# Patient Record
Sex: Male | Born: 1990 | Race: Black or African American | Hispanic: No | Marital: Single
Health system: Southern US, Community
[De-identification: ages and names within clinical notes are randomized; demographics above are authoritative.]

## PROBLEM LIST (undated history)

## (undated) ENCOUNTER — Emergency Department (HOSPITAL_COMMUNITY): Admission: EM | Payer: Self-pay

## (undated) ENCOUNTER — Emergency Department (HOSPITAL_COMMUNITY): Admission: EM | Payer: Managed Care, Other (non HMO) | Source: Home / Self Care

## (undated) DIAGNOSIS — J45909 Unspecified asthma, uncomplicated: Secondary | ICD-10-CM

## (undated) HISTORY — PX: TONSILLECTOMY: SUR1361

## (undated) HISTORY — PX: HERNIA REPAIR: SHX51

---
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
## 2014-10-05 ENCOUNTER — Inpatient Hospital Stay (HOSPITAL_COMMUNITY)
Admission: AD | Admit: 2014-10-05 | Discharge: 2014-10-07 | DRG: 882 | Disposition: A | Discharge status: Home / Self Care | Payer: 59 | Source: Intra-hospital | Attending: Psychiatry | Admitting: Psychiatry

## 2014-10-05 ENCOUNTER — Encounter (HOSPITAL_COMMUNITY): Payer: Self-pay | Admitting: Behavioral Health

## 2014-10-05 ENCOUNTER — Emergency Department (HOSPITAL_COMMUNITY)
Admission: EM | Admit: 2014-10-05 | Discharge: 2014-10-05 | Disposition: A | Discharge status: Other Acute Inpatient Hospital | Payer: 59 | Attending: Emergency Medicine | Admitting: Emergency Medicine

## 2014-10-05 ENCOUNTER — Encounter (HOSPITAL_COMMUNITY): Payer: Self-pay | Admitting: Emergency Medicine

## 2014-10-05 DIAGNOSIS — Z720 Tobacco use: Secondary | ICD-10-CM | POA: Diagnosis not present

## 2014-10-05 DIAGNOSIS — F17210 Nicotine dependence, cigarettes, uncomplicated: Secondary | ICD-10-CM | POA: Diagnosis present

## 2014-10-05 DIAGNOSIS — R451 Restlessness and agitation: Secondary | ICD-10-CM | POA: Insufficient documentation

## 2014-10-05 DIAGNOSIS — R45850 Homicidal ideations: Secondary | ICD-10-CM

## 2014-10-05 DIAGNOSIS — Z79899 Other long term (current) drug therapy: Secondary | ICD-10-CM | POA: Insufficient documentation

## 2014-10-05 DIAGNOSIS — J45909 Unspecified asthma, uncomplicated: Secondary | ICD-10-CM | POA: Diagnosis present

## 2014-10-05 DIAGNOSIS — R454 Irritability and anger: Secondary | ICD-10-CM

## 2014-10-05 DIAGNOSIS — F4325 Adjustment disorder with mixed disturbance of emotions and conduct: Secondary | ICD-10-CM | POA: Diagnosis not present

## 2014-10-05 DIAGNOSIS — F4329 Adjustment disorder with other symptoms: Principal | ICD-10-CM | POA: Diagnosis present

## 2014-10-05 DIAGNOSIS — F99 Mental disorder, not otherwise specified: Secondary | ICD-10-CM | POA: Diagnosis present

## 2014-10-05 HISTORY — DX: Unspecified asthma, uncomplicated: J45.909

## 2014-10-05 LAB — BASIC METABOLIC PANEL
ANION GAP: 8 (ref 5–15)
BUN: 15 mg/dL (ref 6–20)
CALCIUM: 9.4 mg/dL (ref 8.9–10.3)
CO2: 25 mmol/L (ref 22–32)
CREATININE: 1.01 mg/dL (ref 0.61–1.24)
Chloride: 106 mmol/L (ref 101–111)
GFR calc Af Amer: >60 mL/min (ref >60)
Glucose, Bld: 88 mg/dL (ref 65–99)
Potassium: 3.9 mmol/L (ref 3.5–5.1)
Sodium: 139 mmol/L (ref 135–145)

## 2014-10-05 LAB — RAPID URINE DRUG SCREEN, HOSP PERFORMED
AMPHETAMINES: NOT DETECTED
BENZODIAZEPINES: NOT DETECTED
Barbiturates: NOT DETECTED
COCAINE: NOT DETECTED
OPIATES: NOT DETECTED
TETRAHYDROCANNABINOL: POSITIVE — AB

## 2014-10-05 LAB — CBC WITH DIFFERENTIAL/PLATELET
BASOS PCT: 1 % (ref 0–1)
Basophils Absolute: 0 10*3/uL (ref 0.0–0.1)
EOS ABS: 0.4 10*3/uL (ref 0.0–0.7)
EOS PCT: 10 % — AB (ref 0–5)
HEMATOCRIT: 43.1 % (ref 39.0–52.0)
HEMOGLOBIN: 15.2 g/dL (ref 13.0–17.0)
LYMPHS ABS: 1.5 10*3/uL (ref 0.7–4.0)
Lymphocytes Relative: 35 % (ref 12–46)
MCH: 30.7 pg (ref 26.0–34.0)
MCHC: 35.3 g/dL (ref 30.0–36.0)
MCV: 87.1 fL (ref 78.0–100.0)
MONO ABS: 0.5 10*3/uL (ref 0.1–1.0)
MONOS PCT: 11 % (ref 3–12)
Neutro Abs: 1.9 10*3/uL (ref 1.7–7.7)
Neutrophils Relative %: 43 % (ref 43–77)
Platelets: 184 10*3/uL (ref 150–400)
RBC: 4.95 MIL/uL (ref 4.22–5.81)
RDW: 12.5 % (ref 11.5–15.5)
WBC: 4.3 10*3/uL (ref 4.0–10.5)

## 2014-10-05 LAB — ACETAMINOPHEN LEVEL

## 2014-10-05 LAB — SALICYLATE LEVEL: Salicylate Lvl: <4 mg/dL (ref 2.8–30.0)

## 2014-10-05 LAB — ETHANOL

## 2014-10-05 MED ORDER — ACETAMINOPHEN 325 MG PO TABS: 650.0000 mg | ORAL_TABLET | Freq: Four times a day (QID) | ORAL | Status: DC | PRN

## 2014-10-05 MED ORDER — NICOTINE 21 MG/24HR TD PT24
21.0000 mg | MEDICATED_PATCH | Freq: Every day | TRANSDERMAL | Status: DC
  Administered 2014-10-05: 21 mg via TRANSDERMAL
  Filled 2014-10-05: qty 1

## 2014-10-05 MED ORDER — ALUM & MAG HYDROXIDE-SIMETH 200-200-20 MG/5ML PO SUSP: 30.0000 mL | ORAL | Status: DC | PRN

## 2014-10-05 MED ORDER — TRAZODONE HCL 50 MG PO TABS
50.0000 mg | ORAL_TABLET | Freq: Every day | ORAL | Status: DC
  Administered 2014-10-05 – 2014-10-06 (×2): 50 mg via ORAL
  Filled 2014-10-05 (×5): qty 1
  Filled 2014-10-05: qty 14

## 2014-10-05 MED ORDER — LORAZEPAM 1 MG PO TABS
1.0000 mg | ORAL_TABLET | Freq: Three times a day (TID) | ORAL | Status: DC | PRN
  Administered 2014-10-05: 1 mg via ORAL
  Filled 2014-10-05: qty 1

## 2014-10-05 MED ORDER — ALBUTEROL SULFATE HFA 108 (90 BASE) MCG/ACT IN AERS: 1.0000 | INHALATION_SPRAY | Freq: Four times a day (QID) | RESPIRATORY_TRACT | Status: DC | PRN

## 2014-10-05 MED ORDER — IBUPROFEN 400 MG PO TABS: 600.0000 mg | ORAL_TABLET | Freq: Three times a day (TID) | ORAL | Status: DC | PRN

## 2014-10-05 MED ORDER — IBUPROFEN 400 MG PO TABS: 400.0000 mg | ORAL_TABLET | Freq: Four times a day (QID) | ORAL | Status: DC | PRN

## 2014-10-05 MED ORDER — LORATADINE 10 MG PO TABS
10.0000 mg | ORAL_TABLET | Freq: Every day | ORAL | Status: DC
  Administered 2014-10-05 – 2014-10-07 (×3): 10 mg via ORAL
  Filled 2014-10-05 (×7): qty 1

## 2014-10-05 MED ORDER — NICOTINE 14 MG/24HR TD PT24
14.0000 mg | MEDICATED_PATCH | Freq: Every day | TRANSDERMAL | Status: DC
  Administered 2014-10-06 – 2014-10-07 (×2): 14 mg via TRANSDERMAL
  Filled 2014-10-05 (×6): qty 1

## 2014-10-05 MED ORDER — MAGNESIUM HYDROXIDE 400 MG/5ML PO SUSP: 30.0000 mL | Freq: Every day | ORAL | Status: DC | PRN

## 2014-10-05 NOTE — ED Notes (Signed)
Pt. requesting psychiatric evaluation for his uncontrolled anger and emotional strain/distress onset last year , denies suicidal ideation / no hallucinations.

## 2014-10-05 NOTE — BHH Counselor (Signed)
Per Thurman CoyerEric Kaplan RN, pt has been accepted to bed 400-2. Writer notified pt's RN Kriste BasqueBecky and asked that pt be transported after 12 pm.  Evette Cristalaroline Paige Eisen Robenson, [place] Therapeutic Triage Specialist
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
## 2014-10-05 NOTE — ED Notes (Signed)
Trained sitter at bedside. 

## 2014-10-05 NOTE — BHH Counselor (Signed)
Pt. is being reviewed for possible placement with [facility]. 

## 2014-10-05 NOTE — ED Notes (Signed)
Pt has been wanded by security, belongings with mother.
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
## 2014-10-05 NOTE — ED Notes (Signed)
[facility] Id[facility] NeedlePaige, [facility] Dr Tobie PoetWithrow - may transport after 1200.

## 2014-10-05 NOTE — Progress Notes (Signed)
[facility] Group Notes:  (Nursing/MHT/Case Management/Adjunct)  Date:  10/05/2014  Time:  8:46 PM  Type of Therapy:  Psychoeducational Skills  Participation Level:  Active  Participation Quality:  Appropriate  Affect:  Appropriate  Cognitive:  Appropriate  Insight:  Appropriate  Engagement in Group:  Limited  Modes of Intervention:  Discussion  Summary of Progress/Problems: Tonight in wrap up group, Jamaury was limited in his conversation but he did mention that today was his first day and that it was ok. He was interested in the 72hr discharge and was informed more from his nurse on this information. Madaline SavageDiamond N Keneshia Tena 10/05/2014, 8:46 PM

## 2014-10-05 NOTE — Progress Notes (Signed)
Recreation Therapy Notes  Animal-Assisted Activity (AAA) Program Checklist/Progress Notes Patient Eligibility Criteria Checklist & Daily Group note for Rec Tx Intervention  Date: 10/05/14 Time: 2:30pm Location: 400 Hall Dayroom   AAA/T Program Assumption of Risk Form signed by Patient/ or Parent Legal Guardian YES   Patient is free of allergies or sever asthma YES   Patient reports no fear of animals YES  Patient reports no history of cruelty to animals YES   Patient understands his/her participation is voluntary YES   Patient washes hands before animal contact YES   Patient washes hands after animal contact YES  Behavioral Response: None  Education: Hand Washing, Appropriate Animal Interaction   Education Outcome: Acknowledges understanding/In group clarification offered/Needs additional education.   Clinical Observations/Feedback: Patient did not attend group.  Ferol Laiche, LRT, CTRS         Aslyn Cottman A 10/05/2014 4:14 PM 

## 2014-10-05 NOTE — ED Notes (Signed)
Called staffing office for sitter, charge RN aware.

## 2014-10-06 ENCOUNTER — Encounter (HOSPITAL_COMMUNITY): Payer: Self-pay | Admitting: Psychiatry

## 2014-10-06 DIAGNOSIS — F4325 Adjustment disorder with mixed disturbance of emotions and conduct: Secondary | ICD-10-CM

## 2014-10-06 DIAGNOSIS — R45850 Homicidal ideations: Secondary | ICD-10-CM

## 2014-10-06 MED ORDER — OLANZAPINE 5 MG PO TABS
5.0000 mg | ORAL_TABLET | Freq: Every day | ORAL | Status: DC
  Administered 2014-10-06: 5 mg via ORAL
  Filled 2014-10-06: qty 14
  Filled 2014-10-06 (×3): qty 1

## 2014-10-06 NOTE — Progress Notes (Signed)
Recreation Therapy Notes  Date:  10/06/14 Time: 9:30am Location: 300 Hall Dayroom  Group Topic: Stress Management  Goal Area(s) Addresses:  Patient will actively participate in stress management techniques presented during session.   Intervention: Stress management techniques  Activity:  Guided Imagery. LRT read a script that walked patients through the technique Guided Imagery. Technique was coupled with deep breathing.   Education:  Stress Management, Discharge Planning.   Clinical Observations/Feedback: Patient did not attend group.   Chaitanya Amedee LRT/CTRS         Latreshia Beauchaine A 10/06/2014 3:44 PM 
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
## 2014-10-06 NOTE — Plan of Care (Signed)
Problem: Ineffective individual coping Goal: STG:Pt. will utilize relaxation techniques to reduce stress STG: Patient will utilize relaxation techniques to reduce stress levels  Outcome: Not Progressing Patient unreceptive to admission. Slamming, banging phone.  Problem: Alteration in mood Goal: STG-Patient is able to discuss feelings and issues (Patient is able to discuss feelings and issues leading to depression)  Outcome: Not Progressing Patient will only discuss when he will be seen by the MD and subsequently released.

## 2014-10-06 NOTE — [facility] Group Notes (Signed)
[facility]   10/06/2014 9:30 AM  Participation Quality:  Did not attend group.  Robert [name], Robert [name]

## 2014-10-06 NOTE — Progress Notes (Signed)
[facility] Group Notes:  (Nursing/MHT/Case Management/Adjunct)  Date:  10/06/2014  Time:  10:10 PM  Type of Therapy:  Psychoeducational Skills  Participation Level:  Active  Participation Quality:  Appropriate  Affect:  Appropriate  Cognitive:  Appropriate  Insight:  Appropriate  Engagement in Group:  Engaged  Modes of Intervention:  Discussion  Summary of Progress/Problems: Tonight in Wrap up group Munachimso stated that his day did not go so well and that his goal was just for him to talk to him doctor so that he could be discharged. Madaline Savageiamond N Shamar Engelmann 10/06/2014, 10:10 PM

## 2014-10-06 NOTE — [facility] Suicide Risk Assessment (Signed)
[facility] INPATIENT:  Family/Significant Other Suicide Prevention Education  Suicide Prevention Education:  Education Completed; Deitra MayoLeslie Bensman (mother) [phone],  (name of family member/significant other) has been identified by the patient as the family member/significant other with whom the patient will be residing, and identified as the person(s) who will aid the patient in the event of a mental health crisis (suicidal ideations/suicide attempt).  With written consent from the patient, the family member/significant other has been provided the following suicide prevention education, prior to the and/or following the discharge of the patient.  The suicide prevention education provided includes the following:  Suicide risk factors  Suicide prevention and interventions  National Suicide Hotline telephone number  [facility] assessment telephone number  [facility] Assistance 911  [facility] and/or [facility] telephone number  Request made of family/significant other to:  Remove weapons (e.g., guns, rifles, knives), all items previously/currently identified as safety concern.    Remove drugs/medications (over-the-counter, prescriptions, illicit drugs), all items previously/currently identified as a safety concern.  The family member/significant other verbalizes understanding of the suicide prevention education information provided.  The family member/significant other agrees to remove the items of safety concern listed above.  Reviewed SPE w mother, mother states patient does not live in her home.  She is unsure whether he has access to guns, says "these kids they can get whatever they want on the street."  Mother unsure whether patient has access to medications or other areas of concern.  Mother received information on risks, warning signs and appropriate responses to suicide, voiced no further questions or concerns.  Santa GeneraAnne Kadar Chance,  LCSW Clinical Social Worker   Sallee LangeCunningham, Corinne Goucher C 10/06/2014, 6:04 PM

## 2014-10-06 NOTE — Clinical Social Work Note (Signed)
Attempted to contact Robert [name] at [phone] as required for "Duty to Warn."  Got recording stating "This person has a voice mail box that has not been set up yet."  Called a second time with same result.
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
## 2014-10-06 NOTE — Clinical Social Work Note (Signed)
CSW spoke with Jackquline Denmarkon Causey, Interior and spatial designerDirector of Security for [facility].  Roe CoombsDon was informed patient had settled down and agreed to work with MD for discharge tomorrow.  Don agreed that there would not be sufficient information to notify police by he would make security at [facility] aware of potential for problems.
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
## 2014-10-06 NOTE — B[facility] Suicide Risk Assessment (Signed)
[facility]   Nursing information obtained from:  Patient Demographic factors:  Male, Adolescent or young adult, Living alone, Unemployed Current Mental Status:  Suicidal ideation indicated by patient, Suicidal ideation indicated by others, Thoughts of violence towards others, Plan to harm others, Intention to act on plan to harm others Loss Factors:  NA Historical Factors:  Prior suicide attempts, Family history of mental illness or substance abuse Risk Reduction Factors:  Responsible for children under 22 years of age, Positive social support Total Time spent with patient: 45 minutes Principal Problem: Homicidal ideation Diagnosis:   Patient Active Problem List   Diagnosis Date Noted  . Homicidal ideation [R45.850] 10/05/2014     Continued Clinical Symptoms:  Alcohol Use Disorder Identification Test Final Score (AUDIT): 2 The "Alcohol Use Disorders Identification Test", Guidelines for Use in Primary Care, Second Edition.  World Science writerHealth Organization [facility](WHO). Score between 0-7:  no or low risk or alcohol related problems. Score between 8-15:  moderate risk of alcohol related problems. Score between 16-19:  high risk of alcohol related problems. Score 20 or above:  warrants further diagnostic evaluation for alcohol dependence and treatment.   CLINICAL FACTORS:  24 year old man, came to hospital at request of mother/grandmoteher. Reports anger and HI towards his baby's mother and a man she is with. He describes a chaotic and confusing relationship with her, where he feels intermittently rejected by her and where he is currently unable to see his child. Anger at this time does not appear to be in the context of a severe underlying psychiatric illness, and patient does not present with mania, psychosis, or severe anxiety. Denies drug abuse other than occasional cannabis use. At this time, although posturing that he could if he wanted to have this man killed by his  friends, he is denying any plan or intention of violence towards them at this time and stating that his plan is to move to [place] so he can remove self from chaotic relationships and focus on  Making money/rapping with the intention of trying to gain custody of his child via legal means. He is focused on being discharged and has submitted a letter requesting discharge .    Musculoskeletal: Strength & Muscle Tone: within normal limits Gait & Station: normal Patient leans: N/A  Psychiatric Specialty Exam: Physical Exam  ROS  Blood pressure 103/78, pulse 80, temperature 97.7 F (36.5 C), temperature source Oral, resp. rate 18, height 5' 8.5" (1.74 m), weight 126 lb (57.153 kg).Body mass index is 18.88 kg/(m^2).  See admit note MSE                                                        COGNITIVE FEATURES THAT CONTRIBUTE TO RISK:  Closed-mindedness and Polarized thinking    SUICIDE RISK:   Mild:  Suicidal ideation of limited frequency, intensity, duration, and specificity.  There are no identifiable plans, no associated intent, mild dysphoria and related symptoms, good self-control (both objective and subjective assessment), few other risk factors, and identifiable protective factors, including available and accessible social support.  PLAN OF CARE: Patient will be admitted to inpatient psychiatric unit for stabilization and safety. Will provide and encourage milieu participation. Provide medication management and maked adjustments as needed.  Will follow daily.    Medical Decision Making:  Review of Psycho-Social Stressors (1), Review or order clinical lab tests (1), Established Problem, Worsening (2) and Review of Medication Regimen & Side Effects (2)  I certify that inpatient services furnished can reasonably be expected to improve the patient's condition.   COBOS, FERNANDO 10/06/2014, 4:32 PM

## 2014-10-06 NOTE — Progress Notes (Signed)
D    Pt has been quiet and cooperative   He did sign his 72 hour request for discharge  He attended group and did not remark about any homicidal ideations A   Verbal support given   Medications administered and effectiveness monitored    Q 15 min checks R   Pt safe at present

## 2014-10-06 NOTE — BHH Suicide Risk Assessment (Deleted)
BHH INPATIENT:  Family/Significant Other Suicide Prevention Education  Suicide Prevention Education:  Contact Attempts: Deitra MayoLeslie Ehler (mother) [phone], (name of family member/significant other) has been identified by the patient as the family member/significant other with whom the patient will be residing, and identified as the person(s) who will aid the patient in the event of a mental health crisis.  With written consent from the patient, two attempts were made to provide suicide prevention education, prior to and/or following the patient's discharge.  We were unsuccessful in providing suicide prevention education.  A suicide education pamphlet was given to the patient to share with family/significant other.  Date and time of first attempt: 10/06/14/5:40 PM  10/06/14/6:00 PM:  CSW spoke w mother, Deitra MayoLeslie Cisek.  Reviewed SPE information, mother states that patient does not live in her home.  Is not sure whether he has access to guns   Sallee LangeCunningham, Ilze Roselli C 10/06/2014, 5:35 PM

## 2014-10-06 NOTE — B[facility] Group Notes (Signed)
[facility]  10/06/2014 2:58 PM  Type of Therapy:  Group Therapy  Participation Level:  Did Not Attend    Wynn BankerHodnett, Cortnee Steinmiller Hairston 10/06/2014, 2:58 PM

## 2014-10-06 NOTE — Progress Notes (Signed)
D    Pt has been quiet and cooperative   He did sign his 72 hour request for discharge  He attended group and did not remark about any homicidal ideations  He is cooperative and requesting information about his medications  A   Verbal support given   Medications administered and effectiveness monitored    Q 15 min checks R   Pt safe at present and remains calm and cooperative
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
## 2014-10-06 NOTE — Progress Notes (Signed)
Patient agitated, angry this morning. Observed cursing, speaking in hostile way to mother on phone. Slamming and banging phone on cradle multiple times. Patient focused on discharge. States he has never been on meds nor has he received mental health treatment. Admits to thoughts to harm child's mother and her BF and "that's never going to change." "We've fought before." When asked why he hasn't killed them before he responds, "because I don't have a car." Patient eyeing front doors frequently and is at risk for elopement. Patient encouraged to keep behavior under control as acting out will only prolong his admission. Patient reluctantly contracts for safety though remains impulsive. States, "I'm getting out of here one way or another. I will break glass, I will get out." Will update MD in treatment team. No available prn's to be given. Patient calm at present. Denies pain, physical problems. No AVH/SI. Robert [name], Robert [name]

## 2014-10-07 MED ORDER — OLANZAPINE 5 MG PO TABS: 5.0000 mg | ORAL_TABLET | Freq: Every day | ORAL | Status: DC

## 2014-10-07 MED ORDER — TRAZODONE HCL 50 MG PO TABS: 50.0000 mg | ORAL_TABLET | Freq: Every evening | ORAL | Status: DC | PRN

## 2014-10-07 NOTE — Progress Notes (Signed)
Patient packed and anxious for discharge. Teaching completed, Rx's given along with sample meds. All belongings returned. Patient verbalizes understanding. Patient assures this RN he is not having homicidal thoughts or SI. Denies any plans to harm anyone upon discharge. Discharged in stable condition to grandparents. [name], Scottie Stanish Eakes

## 2014-10-07 NOTE — Progress Notes (Signed)
  [facility] Discharge [facility] :  Will you be returning to the same living situation after discharge:  Yes,  Patient is returning to his home. At discharge, do you have transportation home?: Yes,  Patient is arranging transportation home. Do you have the ability to pay for your medications: Yes,  Patient able to obtain medications.  Release of information consent forms completed and in the chart;  Patient's signature needed at discharge.  Patient to Follow up at: Follow-up Information    Follow up with Dr Jannifer FranklinAkintayo On 11/05/2014.   Why:  You are scheduled with Dr. Jannifer FranklinAkintayo on Friday, November 05, 2014 at 12:30 PM   Contact information:   [address],  [place], [place] [address] [phone]      [facility]   Why:  Patient will be called with a counseling appointment   Contact information:   [address] [place], [place]   [id number]  [phone]      Patient denies SI/HI: Patient no longer endorsing SI/HI or other thoughts of self harm.  Safety Planning and Suicide Prevention discussed:  .Reviewed with all patients during discharge planning group   Have you used any form of tobacco in the last 30 days? (Cigarettes, Smokeless Tobacco, Cigars, and/or Pipes): Yes  Has patient been referred to the Quitline?:Patient declined referral to Quitline.   Wynn BankerHodnett, [place] Kuang Hairston 10/07/2014, 12:05 PM

## 2014-10-07 NOTE — BHH Group Notes (Signed)
BHH Group Notes:  (Nursing/MHT/Case Management/Adjunct)  Date:  10/07/2014  Time:  0900  Type of Therapy:  Nurse Education  Participation Level:  Did Not Attend  Participation Quality:      Affect:    Cognitive:    Insight:    Engagement in Group:    Modes of Intervention:    Summary of Progress/Problems: Patient was invited to group however elected to remain in bed.  Merian CapronFriedman, Mckinnley Cottier [facility] 10/07/2014, 0930

## 2014-10-07 NOTE — Clinical Social Work Note (Addendum)
CSW attempted to contact [facility] McBroom, [phone],  regarding duty to warn.  CSW also attempt on 10/06/14 but unable to leave a message on either call.  Message on phone advised unable to leave message as not set up for voicemail.   CS[place] [employer] to assist with duty to warn.  CSW spoke with Fayrene FearingJames in Communications to provide contact information.  Patient had given an address of [address] but Officer Fayrene FearingJames was [place]  He was give Mr. McBoom's phone number which showed an event history for that phone number at [address].  Officer Fayrene FearingJames to have someone go to the address for duty to warn and will have the officer call back regarding contact.  CSW received a call back from Officer Carlynn Purlerez advised he went to the residence and knocked on the door but no one answered.  CSW made an additional attempt to contact Mr. McBroom by phone but no answer.
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
## 2014-10-07 NOTE — Progress Notes (Signed)
Patient much calmer this morning. Resting in bed. No threats of harming self, others or property and behavior is congruent. Affect somewhat flat but mood stable. States he tolerated the zyprexa last night but notices increased appetite. Medicated per orders. Offered support and encouragement to remain safe upon discharge or seek help. He denies the previous thoughts he had to harm his ex-gf and her now bf. Denies SI/AVH. Patient safe, resting in bed. [name], Robert [name]

## 2014-10-07 NOTE — Clinical Social Work Note (Signed)
CS[facility], [facility], advised her agency does not work with [facility] insurance at this time.  CSW also contact [facility] and was informed that counselor is not on [facility].  CSW will attempt to scheduled [facility] and call patient at home with the appointment.

## 2014-10-07 NOTE — Progress Notes (Signed)
Patient ID: [name], male   DOB: [birth year], 24 y.o.   MRN: [id number] Aurea GraffJerrel is in full contact with reality. He denies and there is no evidence of SI/HI ideas plans or intent. He states he will not do anything to hurt himself or anyone else. Does not want to jeopardize his being able to be there for his 4 M/O son. States he was  upset and might have said things he really did not mean. He wants to be D/C today. He will be willing to take the Zyprexa as states he was able to sleep last night and likes the possibility of gaining some weight on the medication. He plans to go [place] [employer] after being D/C from here.  Based on this evaluation he seems to be ready for D/C Madie RenoIrving A. CentervilleLugo M.D.

## 2017-05-19 ENCOUNTER — Emergency Department (HOSPITAL_COMMUNITY): Payer: 59

## 2017-05-19 ENCOUNTER — Emergency Department (HOSPITAL_COMMUNITY)
Admission: EM | Admit: 2017-05-19 | Discharge: 2017-05-19 | Disposition: A | Discharge status: Home / Self Care | Payer: 59 | Attending: Physician Assistant | Admitting: Physician Assistant

## 2017-05-19 ENCOUNTER — Other Ambulatory Visit: Payer: Self-pay

## 2017-05-19 ENCOUNTER — Encounter (HOSPITAL_COMMUNITY): Payer: Self-pay | Admitting: Emergency Medicine

## 2017-05-19 DIAGNOSIS — R509 Fever, unspecified: Secondary | ICD-10-CM | POA: Diagnosis present

## 2017-05-19 DIAGNOSIS — R0602 Shortness of breath: Secondary | ICD-10-CM | POA: Diagnosis not present

## 2017-05-19 DIAGNOSIS — R0981 Nasal congestion: Secondary | ICD-10-CM | POA: Diagnosis not present

## 2017-05-19 DIAGNOSIS — F17200 Nicotine dependence, unspecified, uncomplicated: Secondary | ICD-10-CM | POA: Insufficient documentation

## 2017-05-19 DIAGNOSIS — Z79899 Other long term (current) drug therapy: Secondary | ICD-10-CM | POA: Diagnosis not present

## 2017-05-19 DIAGNOSIS — R6889 Other general symptoms and signs: Secondary | ICD-10-CM

## 2017-05-19 DIAGNOSIS — J45909 Unspecified asthma, uncomplicated: Secondary | ICD-10-CM | POA: Insufficient documentation

## 2017-05-19 MED ORDER — GUAIFENESIN-CODEINE 100-10 MG/5ML PO SOLN: 5.0000 mL | Freq: Four times a day (QID) | ORAL | 0 refills | Status: DC | PRN

## 2017-05-19 MED ORDER — ALBUTEROL SULFATE (2.5 MG/3ML) 0.083% IN NEBU
5.0000 mg | INHALATION_SOLUTION | Freq: Once | RESPIRATORY_TRACT | Status: AC
  Administered 2017-05-19: 5 mg via RESPIRATORY_TRACT
  Filled 2017-05-19: qty 6

## 2017-05-19 MED ORDER — ALBUTEROL SULFATE (5 MG/ML) 0.5% IN NEBU: 2.5000 mg | INHALATION_SOLUTION | Freq: Four times a day (QID) | RESPIRATORY_TRACT | 0 refills | Status: DC | PRN

## 2017-05-19 MED ORDER — IBUPROFEN 800 MG PO TABS
800.0000 mg | ORAL_TABLET | Freq: Once | ORAL | Status: AC
  Administered 2017-05-19: 800 mg via ORAL
  Filled 2017-05-19: qty 1

## 2017-05-19 NOTE — ED Triage Notes (Addendum)
C/o sob, generalized body aches, productive cough with greenish- tan phlegm, and congestion x 4 days.  Reports fever x 2-3 days but states no fever for last 14 hours.

## 2017-05-19 NOTE — Discharge Instructions (Addendum)
Please try to stay hydrated.  Take ibuprofen and Tylenol.  You may take this cough syrup (do not give it any children).  You may use his albuterol nebulized solution in your nebulizer to help with symptoms.

## 2017-05-19 NOTE — ED Provider Notes (Signed)
[facility] Provider Note   CSN: [id number] Arrival date & time: 05/19/17  1542     History   Chief Complaint Chief Complaint  Patient presents with  . Shortness of Breath  . Generalized Body Aches    HPI Robert [name] is a 26 y.o. male.  HPI  Patient is a 26 year old male presenting here today with fever congestion and body aches.  Patient has flulike symptoms.  Reports going on for the last 4 days.  Patient feels like initially was short of breath now feels much improved the last 2 days.  Reports he is run out of his albuterol in his nebulizer at home.  Eating less than usual, taking plenty fluids.  Past Medical History:  Diagnosis Date  . Asthma     Patient Active Problem List   Diagnosis Date Noted  . Homicidal ideation 10/05/2014    Past Surgical History:  Procedure Laterality Date  . HERNIA REPAIR    . TONSILLECTOMY         Home Medications    Prior to Admission medications   Medication Sig Start Date End Date Taking? Authorizing Provider  albuterol (PROVENTIL HFA;VENTOLIN HFA) 108 (90 BASE) MCG/ACT inhaler Inhale 1 puff into the lungs every 6 (six) hours as needed for wheezing or shortness of breath (sob).    [provider]  cetirizine (ZYRTEC) 10 MG tablet Take 10 mg by mouth daily.    [provider]  ibuprofen (ADVIL,MOTRIN) 200 MG tablet Take 400 mg by mouth every 6 (six) hours as needed (pain).    [provider]  OLANZapine (ZYPREXA) 5 MG tablet Take 1 tablet (5 mg total) by mouth at bedtime. 10/07/14   Rankin, Shuvon B, NP  traZODone (DESYREL) 50 MG tablet Take 1 tablet (50 mg total) by mouth at bedtime as needed for sleep. 10/07/14   Rankin, Shuvon B, NP    Family History No family history on file.  Social History Social History   Tobacco Use  . Smoking status: Current Every Day Smoker  . Smokeless tobacco: Never Used  Substance Use Topics  . Alcohol use: Yes  . Drug  use: Yes    Types: Marijuana     Allergies   Maple flavor and Peanut butter flavor   Review of Systems Review of Systems  Constitutional: Positive for fever. Negative for activity change.  HENT: Positive for congestion and sore throat.   Respiratory: Negative for shortness of breath.   Cardiovascular: Negative for chest pain.  Gastrointestinal: Negative for abdominal pain.  Musculoskeletal: Positive for arthralgias and myalgias.  All other systems reviewed and are negative.    Physical Exam Updated Vital Signs BP (!) 125/96 (BP Location: Right Arm)   Pulse 87   Temp 98.4 F (36.9 C) (Oral)   Resp 15   Ht 5\' 10"  (1.778 m)   Wt 72.6 kg (160 lb)   SpO2 99%   BMI 22.96 kg/m   Physical Exam  Constitutional: He is oriented to person, place, and time. He appears well-nourished.  HENT:  Head: Normocephalic and atraumatic.  Eyes: Conjunctivae and EOM are normal.  Normal bilateral TMs.  Mild erythema to posterior pharynx.  Cardiovascular:  Tachycardic.  Pulmonary/Chest: Effort normal and breath sounds normal. No accessory muscle usage. No respiratory distress. He has no decreased breath sounds.  Abdominal: Soft. There is no tenderness.  Neurological: He is oriented to person, place, and time.  Skin: Skin is warm and dry. He  is not diaphoretic.  Psychiatric: He has a normal mood and affect. His behavior is normal.     ED Treatments / Results  Labs (all labs ordered are listed, but only abnormal results are displayed) Labs Reviewed - No data to display  EKG  EKG Interpretation None       Radiology Dg Chest 2 View  Result Date: 05/19/2017 CLINICAL DATA:  Four day history of shortness of breath. EXAM: CHEST  2 VIEW COMPARISON:  None. FINDINGS: Hyperexpansion without focal airspace consolidation or pulmonary edema. No pleural effusion. Interstitial markings are diffusely coarsened with chronic features. The cardiopericardial silhouette is within normal limits for  size. The visualized bony structures of the thorax are intact. Nodular density/densities projecting over the lungs are compatible with pads for telemetry leads. IMPRESSION: Hyperexpansion with underlying chronic interstitial changes. Electronically Signed   By: Kennith CenterEric  Mansell M.D.   On: 05/19/2017 16:47    Procedures Procedures (including critical care time)  Medications Ordered in ED Medications  albuterol (PROVENTIL) (2.5 MG/3ML) 0.083% nebulizer solution 5 mg (5 mg Nebulization Given 05/19/17 1606)     Initial Impression / Assessment and Plan / ED Course  I have reviewed the triage vital signs and the nursing notes.  Pertinent labs & imaging results that were available during my care of the patient were reviewed by me and considered in my medical decision making (see chart for details).     Patient is a 26 year old male presenting here today with fever congestion and body aches.  Patient has flulike symptoms.  Reports going on for the last 4 days.  Patient feels like initially was short of breath now feels much improved the last 2 days.  Reports he is run out of his albuterol in his nebulizer at home.  Eating less than usual, taking plenty fluids.  5:32 PM Patient likely has flu.  Would not benefit from Tamiflu given the duration of illness.  We will give him albuterol for home, cough syrup, instructed to use ibuprofen and Tylenol and return precautions expressed.  Final Clinical Impressions(s) / ED Diagnoses   Final diagnoses:  None    ED Discharge Orders    None       Pa Tennant, Cindee Saltourteney Lyn, MD 05/19/17 1732

## 2017-05-19 NOTE — ED Notes (Signed)
Patient able to ambulate independently  

## 2017-06-21 ENCOUNTER — Encounter (HOSPITAL_COMMUNITY): Payer: Self-pay | Admitting: Emergency Medicine

## 2017-06-21 ENCOUNTER — Emergency Department (HOSPITAL_COMMUNITY)
Admission: EM | Admit: 2017-06-21 | Discharge: 2017-06-21 | Disposition: A | Discharge status: Home / Self Care | Payer: Managed Care, Other (non HMO) | Attending: Emergency Medicine | Admitting: Emergency Medicine

## 2017-06-21 ENCOUNTER — Other Ambulatory Visit: Payer: Self-pay

## 2017-06-21 DIAGNOSIS — F17200 Nicotine dependence, unspecified, uncomplicated: Secondary | ICD-10-CM | POA: Insufficient documentation

## 2017-06-21 DIAGNOSIS — Z79899 Other long term (current) drug therapy: Secondary | ICD-10-CM | POA: Insufficient documentation

## 2017-06-21 DIAGNOSIS — J45901 Unspecified asthma with (acute) exacerbation: Secondary | ICD-10-CM | POA: Insufficient documentation

## 2017-06-21 DIAGNOSIS — Z760 Encounter for issue of repeat prescription: Secondary | ICD-10-CM | POA: Insufficient documentation

## 2017-06-21 DIAGNOSIS — R062 Wheezing: Secondary | ICD-10-CM | POA: Diagnosis present

## 2017-06-21 DIAGNOSIS — Z91010 Allergy to peanuts: Secondary | ICD-10-CM | POA: Diagnosis not present

## 2017-06-21 MED ORDER — ALBUTEROL SULFATE (5 MG/ML) 0.5% IN NEBU: 2.5000 mg | INHALATION_SOLUTION | Freq: Four times a day (QID) | RESPIRATORY_TRACT | 0 refills | Status: DC | PRN

## 2017-06-21 MED ORDER — ALBUTEROL SULFATE (2.5 MG/3ML) 0.083% IN NEBU
INHALATION_SOLUTION | RESPIRATORY_TRACT | Status: AC
  Filled 2017-06-21: qty 6

## 2017-06-21 MED ORDER — ALBUTEROL SULFATE HFA 108 (90 BASE) MCG/ACT IN AERS
1.0000 | INHALATION_SPRAY | Freq: Four times a day (QID) | RESPIRATORY_TRACT | 2 refills | Status: DC | PRN
Start: 2017-06-21 — End: 2017-10-09

## 2017-06-21 MED ORDER — ALBUTEROL SULFATE (2.5 MG/3ML) 0.083% IN NEBU: 5.0000 mg | INHALATION_SOLUTION | Freq: Once | RESPIRATORY_TRACT | Status: DC

## 2017-06-21 NOTE — ED Provider Notes (Signed)
[facility] EMERGENCY DEPARTMENT Provider Note   CSN: [id number] Arrival date & time: 06/21/17  2158     History   Chief Complaint Chief Complaint  Patient presents with  . Asthma  . Medication Refill    HPI Robert [name] is a 27 y.o. male.  Patient presents to the ED with a chief complaint asthma exacerbation.  He states that he has had some worsening asthma and wheezing with the cold weather.  He states that he normally takes albuterol, but has run out.  He denies fevers or chills.  He states that he has had a slight cough.  He reports getting a breathing treatment in triage and his symptoms have resolved.   The history is provided by the patient. No language interpreter was used.    Past Medical History:  Diagnosis Date  . Asthma     Patient Active Problem List   Diagnosis Date Noted  . Homicidal ideation 10/05/2014    Past Surgical History:  Procedure Laterality Date  . HERNIA REPAIR    . TONSILLECTOMY         Home Medications    Prior to Admission medications   Medication Sig Start Date End Date Taking? Authorizing Provider  albuterol (PROVENTIL HFA;VENTOLIN HFA) 108 (90 BASE) MCG/ACT inhaler Inhale 1 puff into the lungs every 6 (six) hours as needed for wheezing or shortness of breath (sob).    [provider]  albuterol (PROVENTIL) (5 MG/ML) 0.5% nebulizer solution Take 0.5 mLs (2.5 mg total) by nebulization every 6 (six) hours as needed for wheezing or shortness of breath. 05/19/17   Mackuen, Courteney Lyn, MD  cetirizine (ZYRTEC) 10 MG tablet Take 10 mg by mouth daily.    [provider]  guaiFENesin-codeine 100-10 MG/5ML syrup Take 5 mLs by mouth every 6 (six) hours as needed for cough. 05/19/17   Mackuen, Courteney Lyn, MD  ibuprofen (ADVIL,MOTRIN) 200 MG tablet Take 400 mg by mouth every 6 (six) hours as needed (pain).    [provider]  OLANZapine (ZYPREXA) 5 MG tablet Take 1 tablet (5 mg total) by mouth  at bedtime. 10/07/14   Rankin, Shuvon B, NP  traZODone (DESYREL) 50 MG tablet Take 1 tablet (50 mg total) by mouth at bedtime as needed for sleep. 10/07/14   Rankin, Rada Hay, NP    Family History History reviewed. No pertinent family history.  Social History Social History   Tobacco Use  . Smoking status: Current Every Day Smoker  . Smokeless tobacco: Never Used  Substance Use Topics  . Alcohol use: Yes  . Drug use: Yes    Types: Marijuana     Allergies   Maple flavor and Peanut butter flavor   Review of Systems Review of Systems  All other systems reviewed and are negative.    Physical Exam Updated Vital Signs BP 115/77 (BP Location: Right Arm)   Pulse 70   Temp (!) 97.3 F (36.3 C) (Oral)   Resp 18   SpO2 94%   Physical Exam  Constitutional: He is oriented to person, place, and time. He appears well-developed and well-nourished.  HENT:  Head: Normocephalic and atraumatic.  Eyes: Conjunctivae and EOM are normal. Pupils are equal, round, and reactive to light. Right eye exhibits no discharge. Left eye exhibits no discharge. No scleral icterus.  Neck: Normal range of motion. Neck supple. No JVD present.  Cardiovascular: Normal rate, regular rhythm and normal heart sounds. Exam reveals no gallop and no  friction rub.  No murmur heard. Pulmonary/Chest: Effort normal. No respiratory distress. He has no wheezes. He has no rales. He exhibits no tenderness.  Abdominal: Soft. He exhibits no distension and no mass. There is no tenderness. There is no rebound and no guarding.  Musculoskeletal: Normal range of motion. He exhibits no edema or tenderness.  Neurological: He is alert and oriented to person, place, and time.  Skin: Skin is warm and dry.  Psychiatric: He has a normal mood and affect. His behavior is normal. Judgment and thought content normal.  Nursing note and vitals reviewed.    ED Treatments / Results  Labs (all labs ordered are listed, but only abnormal  results are displayed) Labs Reviewed - No data to display  EKG  EKG Interpretation None       Radiology No results found.  Procedures Procedures (including critical care time)  Medications Ordered in ED Medications  albuterol (PROVENTIL) (2.5 MG/3ML) 0.083% nebulizer solution 5 mg (not administered)  albuterol (PROVENTIL) (2.5 MG/3ML) 0.083% nebulizer solution (not administered)     Initial Impression / Assessment and Plan / ED Course  I have reviewed the triage vital signs and the nursing notes.  Pertinent labs & imaging results that were available during my care of the patient were reviewed by me and considered in my medical decision making (see chart for details).     Patient with asthma exacerbation and wheezing.  Feels well now after breathing treatment.  Requests refill of albuterol.  Will discharge home.  PCP follow-up.  Final Clinical Impressions(s) / ED Diagnoses   Final diagnoses:  Medication refill  Exacerbation of asthma, unspecified asthma severity, unspecified whether persistent    ED Discharge Orders        Ordered    albuterol (PROVENTIL HFA;VENTOLIN HFA) 108 (90 Base) MCG/ACT inhaler  Every 6 hours PRN     06/21/17 2313    albuterol (PROVENTIL) (5 MG/ML) 0.5% nebulizer solution  Every 6 hours PRN     06/21/17 2313       Roxy HorsemanBrowning, Langley Ingalls, PA-C 06/21/17 2314    Rolan BuccoBelfi, Melanie, MD 06/21/17 2341

## 2017-06-21 NOTE — ED Triage Notes (Signed)
Pt reports that he needs refills for nebulizer. States "I need a breathing treatment ASAP." Active smoking.

## 2017-06-21 NOTE — Discharge Instructions (Signed)
Please talk to your doctor about medications refills.

## 2017-07-03 ENCOUNTER — Other Ambulatory Visit: Payer: Self-pay

## 2017-07-03 ENCOUNTER — Encounter (HOSPITAL_COMMUNITY): Payer: Self-pay | Admitting: Emergency Medicine

## 2017-07-03 ENCOUNTER — Emergency Department (HOSPITAL_COMMUNITY)
Admission: EM | Admit: 2017-07-03 | Discharge: 2017-07-03 | Disposition: A | Discharge status: Home / Self Care | Payer: Managed Care, Other (non HMO) | Attending: Emergency Medicine | Admitting: Emergency Medicine

## 2017-07-03 DIAGNOSIS — Z79899 Other long term (current) drug therapy: Secondary | ICD-10-CM | POA: Insufficient documentation

## 2017-07-03 DIAGNOSIS — F17200 Nicotine dependence, unspecified, uncomplicated: Secondary | ICD-10-CM | POA: Insufficient documentation

## 2017-07-03 DIAGNOSIS — R062 Wheezing: Secondary | ICD-10-CM | POA: Diagnosis present

## 2017-07-03 DIAGNOSIS — J45901 Unspecified asthma with (acute) exacerbation: Secondary | ICD-10-CM | POA: Diagnosis not present

## 2017-07-03 MED ORDER — ALBUTEROL SULFATE (2.5 MG/3ML) 0.083% IN NEBU
5.0000 mg | INHALATION_SOLUTION | Freq: Once | RESPIRATORY_TRACT | Status: AC
  Administered 2017-07-03: 5 mg via RESPIRATORY_TRACT
  Filled 2017-07-03: qty 6

## 2017-07-03 MED ORDER — METHYLPREDNISOLONE SODIUM SUCC 125 MG IJ SOLR
125.0000 mg | Freq: Once | INTRAMUSCULAR | Status: DC
  Filled 2017-07-03: qty 2

## 2017-07-03 MED ORDER — ALBUTEROL SULFATE (2.5 MG/3ML) 0.083% IN NEBU
2.5000 mg | INHALATION_SOLUTION | Freq: Once | RESPIRATORY_TRACT | Status: DC
  Filled 2017-07-03: qty 3

## 2017-07-03 NOTE — ED Provider Notes (Signed)
[facility] EMERGENCY DEPARTMENT Provider Note   CSN: [id number] Arrival date & time: 07/03/17  2047     History   Chief Complaint Chief Complaint  Patient presents with  . Asthma    HPI MEER REINDL is a 27 y.o. male no history of asthma who presents for evaluation of 2 days of intermittent wheezing.  Patient reports that he ran out of his albuterol inhaler and nebulizer low solution 2 days ago.  He reports that since then he has had worsening wheezing.  Patient states that he lives in an area that has a lot of dust and dander which exacerbates his wheezing.  Patient reports that he is not been able to see his primary care doctor to have refills of his medications.  Patient states that he is currently smoking 3-4 cigarettes a day.  Patient denies any fever, chest pain, cough, nasal congestion, rhinorrhea.  The history is provided by the patient.    Past Medical History:  Diagnosis Date  . Asthma     Patient Active Problem List   Diagnosis Date Noted  . Homicidal ideation 10/05/2014    Past Surgical History:  Procedure Laterality Date  . HERNIA REPAIR    . TONSILLECTOMY         Home Medications    Prior to Admission medications   Medication Sig Start Date End Date Taking? Authorizing Provider  albuterol (PROVENTIL HFA;VENTOLIN HFA) 108 (90 Base) MCG/ACT inhaler Inhale 1 puff into the lungs every 6 (six) hours as needed for wheezing or shortness of breath (sob). 06/21/17   Roxy Horseman, PA-C  albuterol (PROVENTIL) (5 MG/ML) 0.5% nebulizer solution Take 0.5 mLs (2.5 mg total) by nebulization every 6 (six) hours as needed for wheezing or shortness of breath. 06/21/17   Roxy Horseman, PA-C  cetirizine (ZYRTEC) 10 MG tablet Take 10 mg by mouth daily.    [provider]  guaiFENesin-codeine 100-10 MG/5ML syrup Take 5 mLs by mouth every 6 (six) hours as needed for cough. 05/19/17   Mackuen, Courteney Lyn, MD  ibuprofen (ADVIL,MOTRIN) 200 MG  tablet Take 400 mg by mouth every 6 (six) hours as needed (pain).    [provider]  OLANZapine (ZYPREXA) 5 MG tablet Take 1 tablet (5 mg total) by mouth at bedtime. 10/07/14   Rankin, Shuvon B, NP  traZODone (DESYREL) 50 MG tablet Take 1 tablet (50 mg total) by mouth at bedtime as needed for sleep. 10/07/14   Rankin, Shuvon B, NP    Family History No family history on file.  Social History Social History   Tobacco Use  . Smoking status: Current Every Day Smoker  . Smokeless tobacco: Never Used  Substance Use Topics  . Alcohol use: Yes  . Drug use: Yes    Types: Marijuana     Allergies   Maple flavor and Peanut butter flavor   Review of Systems Review of Systems  Constitutional: Negative for fever.  HENT: Negative for congestion and rhinorrhea.   Respiratory: Positive for wheezing. Negative for cough and shortness of breath.   Cardiovascular: Negative for chest pain.  Genitourinary: Negative for dysuria.     Physical Exam Updated Vital Signs BP 109/77 (BP Location: Right Arm)   Pulse 75   Temp 98.2 F (36.8 C) (Oral)   Resp 16   SpO2 95%   Physical Exam  Constitutional: He appears well-developed and well-nourished.  HENT:  Head: Normocephalic and atraumatic.  Eyes: Conjunctivae and EOM are normal. Right  eye exhibits no discharge. Left eye exhibits no discharge. No scleral icterus.  Pulmonary/Chest: Effort normal. No respiratory distress. He has wheezes.  No evidence of respiratory distress. Able to speak in full sentences without difficulty.  Diffuse wheezing throughout all lung fields noted bilaterally.  Neurological: He is alert.  Skin: Skin is warm and dry.  Psychiatric: He has a normal mood and affect. His speech is normal and behavior is normal.  Nursing note and vitals reviewed.    ED Treatments / Results  Labs (all labs ordered are listed, but only abnormal results are displayed) Labs Reviewed - No data to display  EKG  EKG  Interpretation None       Radiology No results found.  Procedures Procedures (including critical care time)  Medications Ordered in ED Medications  albuterol (PROVENTIL) (2.5 MG/3ML) 0.083% nebulizer solution 5 mg (5 mg Nebulization Given 07/03/17 2111)     Initial Impression / Assessment and Plan / ED Course  I have reviewed the triage vital signs and the nursing notes.  Pertinent labs & imaging results that were available during my care of the patient were reviewed by me and considered in my medical decision making (see chart for details).     27 y.o. male with past medical areas of asthma who presents for evaluation of wheezing times 2 days.  Has ran out of his albuterol inhaler nebulizer solution.  No fevers, cough, nasal congestion, rhinorrhea. Patient is afebrile, non-toxic appearing, sitting comfortably on examination table. Vital signs reviewed and stable.  No evidence of respiratory distress.  There is evidence of diffuse wheezing throughout all lung fields bilaterally.  Patient started on nebulizer treatment in triage.  Suspect that symptoms are likely secondary to asthma exacerbation from subtherapeutic use of medications and triggers.  Will plan to do steroids and repeat nebulizer here in the department.  I discussed with patient regarding a chest x-ray.  He does not wish to have a chest x-ray at this time.  Discussed risk first benefits of declining a chest x-ray, including but not limited to missed diagnosis.  Patient understands and expresses full understanding of risk first benefits and wishes not have chest x-ray at this time.   I went to evaluate patient after second nebulizer treatment.  He was not in the room.  No  Final Clinical Impressions(s) / ED Diagnoses   Final diagnoses:  Exacerbation of asthma, unspecified asthma severity, unspecified whether persistent    ED Discharge Orders    None       Rosana HoesLayden, Kunal Levario A, PA-C 07/03/17 2325    Terrilee FilesButler, Michael  C, MD 07/04/17 1130

## 2017-07-03 NOTE — ED Triage Notes (Signed)
Pt to ED with recurring asthma, states "I just need a breathing treatment." He states he keeps getting the small albuterol inhalers from here and they run out - takes mainly in the morning and at night, but he states his living situation is bad for this breathing - lots of dander, dust, "dirty people." Wheezing auscultated bilaterally. Resp e/u. Patient states he is going to see his PCP next week.

## 2017-07-03 NOTE — ED Notes (Signed)
Pt left without being seen again by a provider and receiving the full treatment.

## 2017-07-05 ENCOUNTER — Encounter (HOSPITAL_COMMUNITY): Payer: Self-pay | Admitting: Emergency Medicine

## 2017-07-05 ENCOUNTER — Emergency Department (HOSPITAL_COMMUNITY)
Admission: EM | Admit: 2017-07-05 | Discharge: 2017-07-05 | Discharge status: Against Medical Advice | Payer: Managed Care, Other (non HMO) | Attending: Emergency Medicine | Admitting: Emergency Medicine

## 2017-07-05 ENCOUNTER — Other Ambulatory Visit: Payer: Self-pay

## 2017-07-05 DIAGNOSIS — R0602 Shortness of breath: Secondary | ICD-10-CM | POA: Diagnosis not present

## 2017-07-05 DIAGNOSIS — Z5321 Procedure and treatment not carried out due to patient leaving prior to being seen by health care provider: Secondary | ICD-10-CM | POA: Insufficient documentation

## 2017-07-05 MED ORDER — ALBUTEROL SULFATE (2.5 MG/3ML) 0.083% IN NEBU
5.0000 mg | INHALATION_SOLUTION | Freq: Once | RESPIRATORY_TRACT | Status: AC
  Administered 2017-07-05: 5 mg via RESPIRATORY_TRACT
  Filled 2017-07-05: qty 6

## 2017-07-05 NOTE — ED Notes (Signed)
No answer in waiting room at this time.

## 2017-07-05 NOTE — ED Triage Notes (Signed)
Patient here with asthma.  He states that he just needs a breathing treatment and needs a prescription for nebs and inhalers.  Patient in triage states that he feels like he is short of breath.

## 2017-07-05 NOTE — ED Notes (Signed)
Patient not in waiting area.

## 2017-07-18 ENCOUNTER — Emergency Department (HOSPITAL_COMMUNITY)
Admission: EM | Admit: 2017-07-18 | Discharge: 2017-07-18 | Disposition: A | Discharge status: Home / Self Care | Payer: Managed Care, Other (non HMO) | Attending: Emergency Medicine | Admitting: Emergency Medicine

## 2017-07-18 ENCOUNTER — Encounter (HOSPITAL_COMMUNITY): Payer: Self-pay | Admitting: *Deleted

## 2017-07-18 DIAGNOSIS — Z5321 Procedure and treatment not carried out due to patient leaving prior to being seen by health care provider: Secondary | ICD-10-CM | POA: Insufficient documentation

## 2017-07-18 DIAGNOSIS — R0602 Shortness of breath: Secondary | ICD-10-CM | POA: Insufficient documentation

## 2017-07-18 MED ORDER — ALBUTEROL SULFATE (2.5 MG/3ML) 0.083% IN NEBU
5.0000 mg | INHALATION_SOLUTION | Freq: Once | RESPIRATORY_TRACT | Status: AC
  Administered 2017-07-18: 5 mg via RESPIRATORY_TRACT
  Filled 2017-07-18: qty 6

## 2017-07-18 NOTE — ED Notes (Signed)
Bed: [facility] Expected date:  Expected time:  Means of arrival:  Comments: 

## 2017-07-18 NOTE — ED Notes (Signed)
PT left AMA before seen by MD

## 2017-07-18 NOTE — ED Triage Notes (Signed)
Pt complains of shortness of breath since waking up this morning. Pt has hx of asthma but is out of his inhaler. Pt states she feels like he needs a breathing treatment and would like prescription for neb and inhaler.

## 2017-07-19 NOTE — ED Notes (Signed)
07/19/2017, follow-up call completed. Pt. Left due to an appointment, will come back if needed.

## 2017-08-01 ENCOUNTER — Encounter (HOSPITAL_COMMUNITY): Payer: Self-pay | Admitting: Emergency Medicine

## 2017-08-01 NOTE — ED Triage Notes (Signed)
Patient's girlfriend was diagnosed with chlamydia infection today , he is requesting STD screening , denies any symptoms .

## 2017-10-09 ENCOUNTER — Encounter (HOSPITAL_COMMUNITY): Payer: Self-pay

## 2017-10-09 ENCOUNTER — Emergency Department (HOSPITAL_COMMUNITY)
Admission: EM | Admit: 2017-10-09 | Discharge: 2017-10-09 | Disposition: A | Discharge status: Home / Self Care | Payer: Managed Care, Other (non HMO) | Attending: Emergency Medicine | Admitting: Emergency Medicine

## 2017-10-09 ENCOUNTER — Other Ambulatory Visit: Payer: Self-pay

## 2017-10-09 DIAGNOSIS — J45901 Unspecified asthma with (acute) exacerbation: Secondary | ICD-10-CM

## 2017-10-09 DIAGNOSIS — R0602 Shortness of breath: Secondary | ICD-10-CM | POA: Diagnosis present

## 2017-10-09 DIAGNOSIS — F17210 Nicotine dependence, cigarettes, uncomplicated: Secondary | ICD-10-CM | POA: Diagnosis not present

## 2017-10-09 DIAGNOSIS — Z91010 Allergy to peanuts: Secondary | ICD-10-CM | POA: Insufficient documentation

## 2017-10-09 DIAGNOSIS — Z79899 Other long term (current) drug therapy: Secondary | ICD-10-CM | POA: Insufficient documentation

## 2017-10-09 DIAGNOSIS — J4531 Mild persistent asthma with (acute) exacerbation: Secondary | ICD-10-CM | POA: Diagnosis not present

## 2017-10-09 DIAGNOSIS — Z202 Contact with and (suspected) exposure to infections with a predominantly sexual mode of transmission: Secondary | ICD-10-CM | POA: Diagnosis not present

## 2017-10-09 MED ORDER — ALBUTEROL SULFATE (2.5 MG/3ML) 0.083% IN NEBU: 2.5000 mg | INHALATION_SOLUTION | Freq: Four times a day (QID) | RESPIRATORY_TRACT | 0 refills | Status: DC | PRN

## 2017-10-09 MED ORDER — ALBUTEROL SULFATE (2.5 MG/3ML) 0.083% IN NEBU
5.0000 mg | INHALATION_SOLUTION | Freq: Once | RESPIRATORY_TRACT | Status: AC
  Administered 2017-10-09: 5 mg via RESPIRATORY_TRACT
  Filled 2017-10-09: qty 6

## 2017-10-09 MED ORDER — ALBUTEROL SULFATE HFA 108 (90 BASE) MCG/ACT IN AERS
2.0000 | INHALATION_SPRAY | RESPIRATORY_TRACT | Status: DC | PRN
  Administered 2017-10-09: 2 via RESPIRATORY_TRACT
  Filled 2017-10-09: qty 6.7

## 2017-10-09 MED ORDER — ONDANSETRON 4 MG PO TBDP
4.0000 mg | ORAL_TABLET | Freq: Once | ORAL | Status: AC
  Administered 2017-10-09: 4 mg via ORAL
  Filled 2017-10-09: qty 1

## 2017-10-09 MED ORDER — CEFTRIAXONE SODIUM 250 MG IJ SOLR
250.0000 mg | Freq: Once | INTRAMUSCULAR | Status: AC
  Administered 2017-10-09: 250 mg via INTRAMUSCULAR
  Filled 2017-10-09: qty 250

## 2017-10-09 MED ORDER — AZITHROMYCIN 250 MG PO TABS
1000.0000 mg | ORAL_TABLET | Freq: Once | ORAL | Status: AC
  Administered 2017-10-09: 1000 mg via ORAL
  Filled 2017-10-09: qty 4

## 2017-10-09 MED ORDER — DEXAMETHASONE 4 MG PO TABS
10.0000 mg | ORAL_TABLET | Freq: Once | ORAL | Status: AC
  Administered 2017-10-09: 10 mg via ORAL
  Filled 2017-10-09: qty 3

## 2017-10-09 NOTE — ED Provider Notes (Signed)
Robert [name] [facility] EMERGENCY DEPARTMENT Provider Note   CSN: [id number] Arrival date & time: 10/09/17  0356     History   Chief Complaint Chief Complaint  Patient presents with  . Asthma    HPI ISSAIH [name] is a 27 y.o. male.  26 year old male with history of asthma who presents with shortness of breath and STD exposure.  The patient states that he recently ran out of his albuterol inhaler and nebulizer, he normally keeps it at bedside to use at night.  He notes that he sleeps in a room where other people smoke and he smokes 6 to 7 cigarettes/day.  He began feeling more short of breath this evening and did not have any albuterol.  He denies any cough, fevers, or recent illness.  Patient also notes that his girlfriend was recently diagnosed with chlamydia.  He denies any symptoms, specifically no penile discharge, dysuria, or complaints of pain.  He requests empiric treatment for exposure.  The history is provided by the patient.  Asthma     Past Medical History:  Diagnosis Date  . Asthma     Patient Active Problem List   Diagnosis Date Noted  . Homicidal ideation 10/05/2014    Past Surgical History:  Procedure Laterality Date  . HERNIA REPAIR    . TONSILLECTOMY          Home Medications    Prior to Admission medications   Medication Sig Start Date End Date Taking? Authorizing Provider  albuterol (PROVENTIL) (2.5 MG/3ML) 0.083% nebulizer solution Take 3 mLs (2.5 mg total) by nebulization every 6 (six) hours as needed for wheezing or shortness of breath. 10/09/17   Mulan Adan, Ambrose Finland, MD  cetirizine (ZYRTEC) 10 MG tablet Take 10 mg by mouth daily.    [provider]  guaiFENesin-codeine 100-10 MG/5ML syrup Take 5 mLs by mouth every 6 (six) hours as needed for cough. 05/19/17   Mackuen, Courteney Lyn, MD  ibuprofen (ADVIL,MOTRIN) 200 MG tablet Take 400 mg by mouth every 6 (six) hours as needed (pain).    [provider]  OLANZapine  (ZYPREXA) 5 MG tablet Take 1 tablet (5 mg total) by mouth at bedtime. 10/07/14   Rankin, Shuvon B, NP  traZODone (DESYREL) 50 MG tablet Take 1 tablet (50 mg total) by mouth at bedtime as needed for sleep. 10/07/14   Rankin, Shuvon B, NP    Family History No family history on file.  Social History Social History   Tobacco Use  . Smoking status: Current Every Day Smoker  . Smokeless tobacco: Never Used  Substance Use Topics  . Alcohol use: Yes  . Drug use: Yes    Types: Marijuana     Allergies   Maple flavor and Peanut butter flavor   Review of Systems Review of Systems All other systems reviewed and are negative except that which was mentioned in HPI   Physical Exam Updated Vital Signs BP 124/81   Pulse 82   Temp 97.9 F (36.6 C) (Oral)   Resp 20   SpO2 100%   Physical Exam  Constitutional: He is oriented to person, place, and time. He appears well-developed and well-nourished. No distress.  HENT:  Head: Normocephalic and atraumatic.  Moist mucous membranes  Eyes: Pupils are equal, round, and reactive to light.  Bilateral conjunctival injection  Neck: Neck supple.  Cardiovascular: Normal rate, regular rhythm and normal heart sounds.  No murmur heard. Pulmonary/Chest: Effort normal. He has wheezes.  Expiratory wheezes bilaterally,  normal work of breathing  Abdominal: Soft. Bowel sounds are normal. He exhibits no distension. There is no tenderness.  Musculoskeletal: He exhibits no edema.  Neurological: He is alert and oriented to person, place, and time.  Fluent speech  Skin: Skin is warm and dry.  Psychiatric: He has a normal mood and affect. Judgment normal.  Nursing note and vitals reviewed.    ED Treatments / Results  Labs (all labs ordered are listed, but only abnormal results are displayed) Labs Reviewed - No data to display  EKG None  Radiology No results found.  Procedures Procedures (including critical care time)  Medications Ordered in  ED Medications  albuterol (PROVENTIL HFA;VENTOLIN HFA) 108 (90 Base) MCG/ACT inhaler 2 puff (has no administration in time range)  dexamethasone (DECADRON) tablet 10 mg (has no administration in time range)  cefTRIAXone (ROCEPHIN) injection 250 mg (has no administration in time range)  ondansetron (ZOFRAN-ODT) disintegrating tablet 4 mg (has no administration in time range)  azithromycin (ZITHROMAX) tablet 1,000 mg (has no administration in time range)  albuterol (PROVENTIL) (2.5 MG/3ML) 0.083% nebulizer solution 5 mg (5 mg Nebulization Given 10/09/17 0406)     Initial Impression / Assessment and Plan / ED Course  I have reviewed the triage vital signs and the nursing notes.   He was well-appearing on exam with reassuring vital signs.  He had expiratory wheezes but normal work of breathing and no respiratory distress.  He received albuterol in the ED and I gave a dose of Decadron because of wheezing on exam.  Provided with albuterol to use at home.  I discussed the importance of follow-up with PCP as it sounds like he would benefit from a controller medication.  Regarding his chlamydia exposure, I discussed risks and benefits of empiric treatment.  Patient voiced understanding and wanted to proceed.  Gave ceftriaxone and azithromycin along with Zofran.  Discussed safe sex practices and counseled on smoking cessation.  Final Clinical Impressions(s) / ED Diagnoses   Final diagnoses:  Exacerbation of persistent asthma, unspecified asthma severity  STD exposure    ED Discharge Orders        Ordered    albuterol (PROVENTIL) (2.5 MG/3ML) 0.083% nebulizer solution  Every 6 hours PRN     10/09/17 0435       Yalitza Teed, Ambrose Finland, MD 10/09/17 [phone]

## 2017-10-09 NOTE — ED Triage Notes (Signed)
Pt reports that he ran out of his albuterol three weeks ago and his neb treatments yesterday and feeling more SOB. No distress noted. Pt also states that his partner has chlamydia and he wants to be treated for that while he is here, pt requests that he not be swabbed, denies symptoms.

## 2017-11-29 ENCOUNTER — Other Ambulatory Visit: Payer: Self-pay | Admitting: *Deleted

## 2017-11-29 DIAGNOSIS — J45909 Unspecified asthma, uncomplicated: Secondary | ICD-10-CM

## 2017-12-03 ENCOUNTER — Other Ambulatory Visit: Payer: Self-pay

## 2017-12-03 ENCOUNTER — Ambulatory Visit (INDEPENDENT_AMBULATORY_CARE_PROVIDER_SITE_OTHER): Payer: Managed Care, Other (non HMO) | Admitting: Family Medicine

## 2017-12-03 ENCOUNTER — Ambulatory Visit: Payer: Self-pay | Admitting: Family Medicine

## 2017-12-03 ENCOUNTER — Encounter: Payer: Self-pay | Admitting: Family Medicine

## 2017-12-03 VITALS — BP 112/68 | HR 98 | Temp 98.5°F | Resp 14 | Ht 69.5 in | Wt 166.0 lb

## 2017-12-03 DIAGNOSIS — Z0000 Encounter for general adult medical examination without abnormal findings: Secondary | ICD-10-CM

## 2017-12-03 DIAGNOSIS — J45909 Unspecified asthma, uncomplicated: Secondary | ICD-10-CM

## 2017-12-03 DIAGNOSIS — Z720 Tobacco use: Secondary | ICD-10-CM

## 2017-12-03 DIAGNOSIS — Z23 Encounter for immunization: Secondary | ICD-10-CM

## 2017-12-03 DIAGNOSIS — Z113 Encounter for screening for infections with a predominantly sexual mode of transmission: Secondary | ICD-10-CM

## 2017-12-03 MED ORDER — ALBUTEROL SULFATE HFA 108 (90 BASE) MCG/ACT IN AERS: 2.0000 | INHALATION_SPRAY | Freq: Four times a day (QID) | RESPIRATORY_TRACT | 0 refills | Status: DC | PRN

## 2017-12-03 NOTE — Patient Instructions (Signed)
I recommend eye visit once a year I recommend dental visit every 6 months Goal is to  Exercise 30 minutes 5 days a week We will send a letter or call  with lab results  F/U 1 year for [facility][Physical

## 2017-12-03 NOTE — Assessment & Plan Note (Signed)
Mild asthma

## 2017-12-03 NOTE — Progress Notes (Signed)
   Subjective:    Patient ID: Robert [name], male    DOB: Apr 04, 1991, 27 y.o.   MRN: [id number]  Patient presents for New Patient (is not fasting)   Pt here to establish care     Asthma- as a child had allergy, has been years without difficulty until flu this  Past winter, took a few months to get breahting straight  now exercising, and using steam room, and breahting has been okay     Smoking cigarrettes, social marijuana   Daughter coming, Son 3 years    Due for TDAP     Discussed HIV screening   He is currently not working or in school.  States that he has a [employer] line which he is trying to get back on track.   Exercises daily   History reviewed    Review Of Systems:  GEN- denies fatigue, fever, weight loss,weakness, recent illness HEENT- denies eye drainage, change in vision, nasal discharge, CVS- denies chest pain, palpitations RESP- denies SOB, cough, wheeze ABD- denies N/V, change in stools, abd pain GU- denies dysuria, hematuria, dribbling, incontinence MSK- denies joint pain, muscle aches, injury Neuro- denies headache, dizziness, syncope, seizure activity       Objective:    BP 112/68   Pulse 98   Temp 98.5 F (36.9 C) (Oral)   Resp 14   Ht 5' 9.5" (1.765 m)   Wt 166 lb (75.3 kg)   SpO2 98%   BMI 24.16 kg/m   GEN- NAD, alert and oriented x3 HEENT- PERRL, EOMI, non injected sclera, pink conjunctiva, MMM, oropharynx clear Neck- Supple, no thyromegaly CVS- RRR, no murmur RESP-CTAB ABD-NABS,soft,NT,ND Psych- normal affect and mood  EXT- No edema Pulses- Radial, DP- 2+        Assessment & Plan:      Problem List Items Addressed This Visit      Unprioritized   Asthma    Mild asthma      Relevant Medications   albuterol (PROVENTIL HFA;VENTOLIN HFA) 108 (90 Base) MCG/ACT inhaler    Other Visit Diagnoses    Routine general medical examination at a health care facility    -  Primary   CPE done, prn albuterol, discussed tobacco  cessation, fasting labs to be done, STD screen, TDAP given   Relevant Orders   Comprehensive metabolic panel   CBC with Differential/Platelet   Lipid panel   Screen for STD (sexually transmitted disease)       Relevant Orders   HIV antibody   HSV(herpes simplex vrs) 1+2 ab-IgG   RPR   Need for tetanus, diphtheria, and acellular pertussis (Tdap) vaccine in patient of adolescent age or older       Relevant Orders   Tdap vaccine greater than or equal to 7yo IM (Completed)   Tobacco use          Note: This dictation was prepared with Dragon dictation along with smaller phrase technology. Any transcriptional errors that result from this process are unintentional.

## 2017-12-04 LAB — HSV(HERPES SIMPLEX VRS) I + II AB-IGG
HAV 1 IGG,TYPE SPECIFIC AB: <0.9 index
HSV 2 IGG,TYPE SPECIFIC AB: <0.9 index

## 2017-12-04 LAB — COMPREHENSIVE METABOLIC PANEL
AG RATIO: 1.5 (calc) (ref 1.0–2.5)
ALBUMIN MSPROF: 4.3 g/dL (ref 3.6–5.1)
ALKALINE PHOSPHATASE (APISO): 108 U/L (ref 40–115)
ALT: 32 U/L (ref 9–46)
AST: 24 U/L (ref 10–40)
BUN: 11 mg/dL (ref 7–25)
CHLORIDE: 106 mmol/L (ref 98–110)
CO2: 26 mmol/L (ref 20–32)
CREATININE: 1.09 mg/dL (ref 0.60–1.35)
Calcium: 9.7 mg/dL (ref 8.6–10.3)
GLOBULIN: 2.9 g/dL (ref 1.9–3.7)
Glucose, Bld: 96 mg/dL (ref 65–99)
POTASSIUM: 4.4 mmol/L (ref 3.5–5.3)
Sodium: 138 mmol/L (ref 135–146)
Total Bilirubin: 0.4 mg/dL (ref 0.2–1.2)
Total Protein: 7.2 g/dL (ref 6.1–8.1)

## 2017-12-04 LAB — LIPID PANEL
CHOL/HDL RATIO: 2.8 (calc) (ref <5.0)
CHOLESTEROL: 129 mg/dL (ref <200)
HDL: 46 mg/dL (ref >40)
LDL Cholesterol (Calc): 72 mg/dL (calc)
Non-HDL Cholesterol (Calc): 83 mg/dL (calc) (ref <130)
Triglycerides: 40 mg/dL (ref <150)

## 2017-12-04 LAB — CBC WITH DIFFERENTIAL/PLATELET
BASOS PCT: 0.6 %
Basophils Absolute: 38 cells/uL (ref 0–200)
Eosinophils Absolute: 82 cells/uL (ref 15–500)
Eosinophils Relative: 1.3 %
HEMATOCRIT: 43.1 % (ref 38.5–50.0)
HEMOGLOBIN: 14.8 g/dL (ref 13.2–17.1)
LYMPHS ABS: 1247 {cells}/uL (ref 850–3900)
MCH: 30.6 pg (ref 27.0–33.0)
MCHC: 34.3 g/dL (ref 32.0–36.0)
MCV: 89.2 fL (ref 80.0–100.0)
MPV: 9.9 fL (ref 7.5–12.5)
Monocytes Relative: 6.3 %
NEUTROS ABS: 4536 {cells}/uL (ref 1500–7800)
Neutrophils Relative %: 72 %
Platelets: 376 10*3/uL (ref 140–400)
RBC: 4.83 10*6/uL (ref 4.20–5.80)
RDW: 12.6 % (ref 11.0–15.0)
Total Lymphocyte: 19.8 %
WBC: 6.3 10*3/uL (ref 3.8–10.8)
WBCMIX: 397 {cells}/uL (ref 200–950)

## 2017-12-04 LAB — HIV ANTIBODY (ROUTINE TESTING W REFLEX): HIV 1&2 Ab, 4th Generation: NONREACTIVE

## 2017-12-04 LAB — RPR: RPR Ser Ql: NONREACTIVE

## 2018-05-23 ENCOUNTER — Ambulatory Visit: Payer: Self-pay | Admitting: Family Medicine

## 2018-05-23 IMAGING — DX DG CHEST 2V
2 series · 2 of 2 positions shown · non-contrast
Comparison: None.

CLINICAL DATA: Four day history of shortness of breath.

EXAM:
CHEST  2 VIEW

[chest pa]
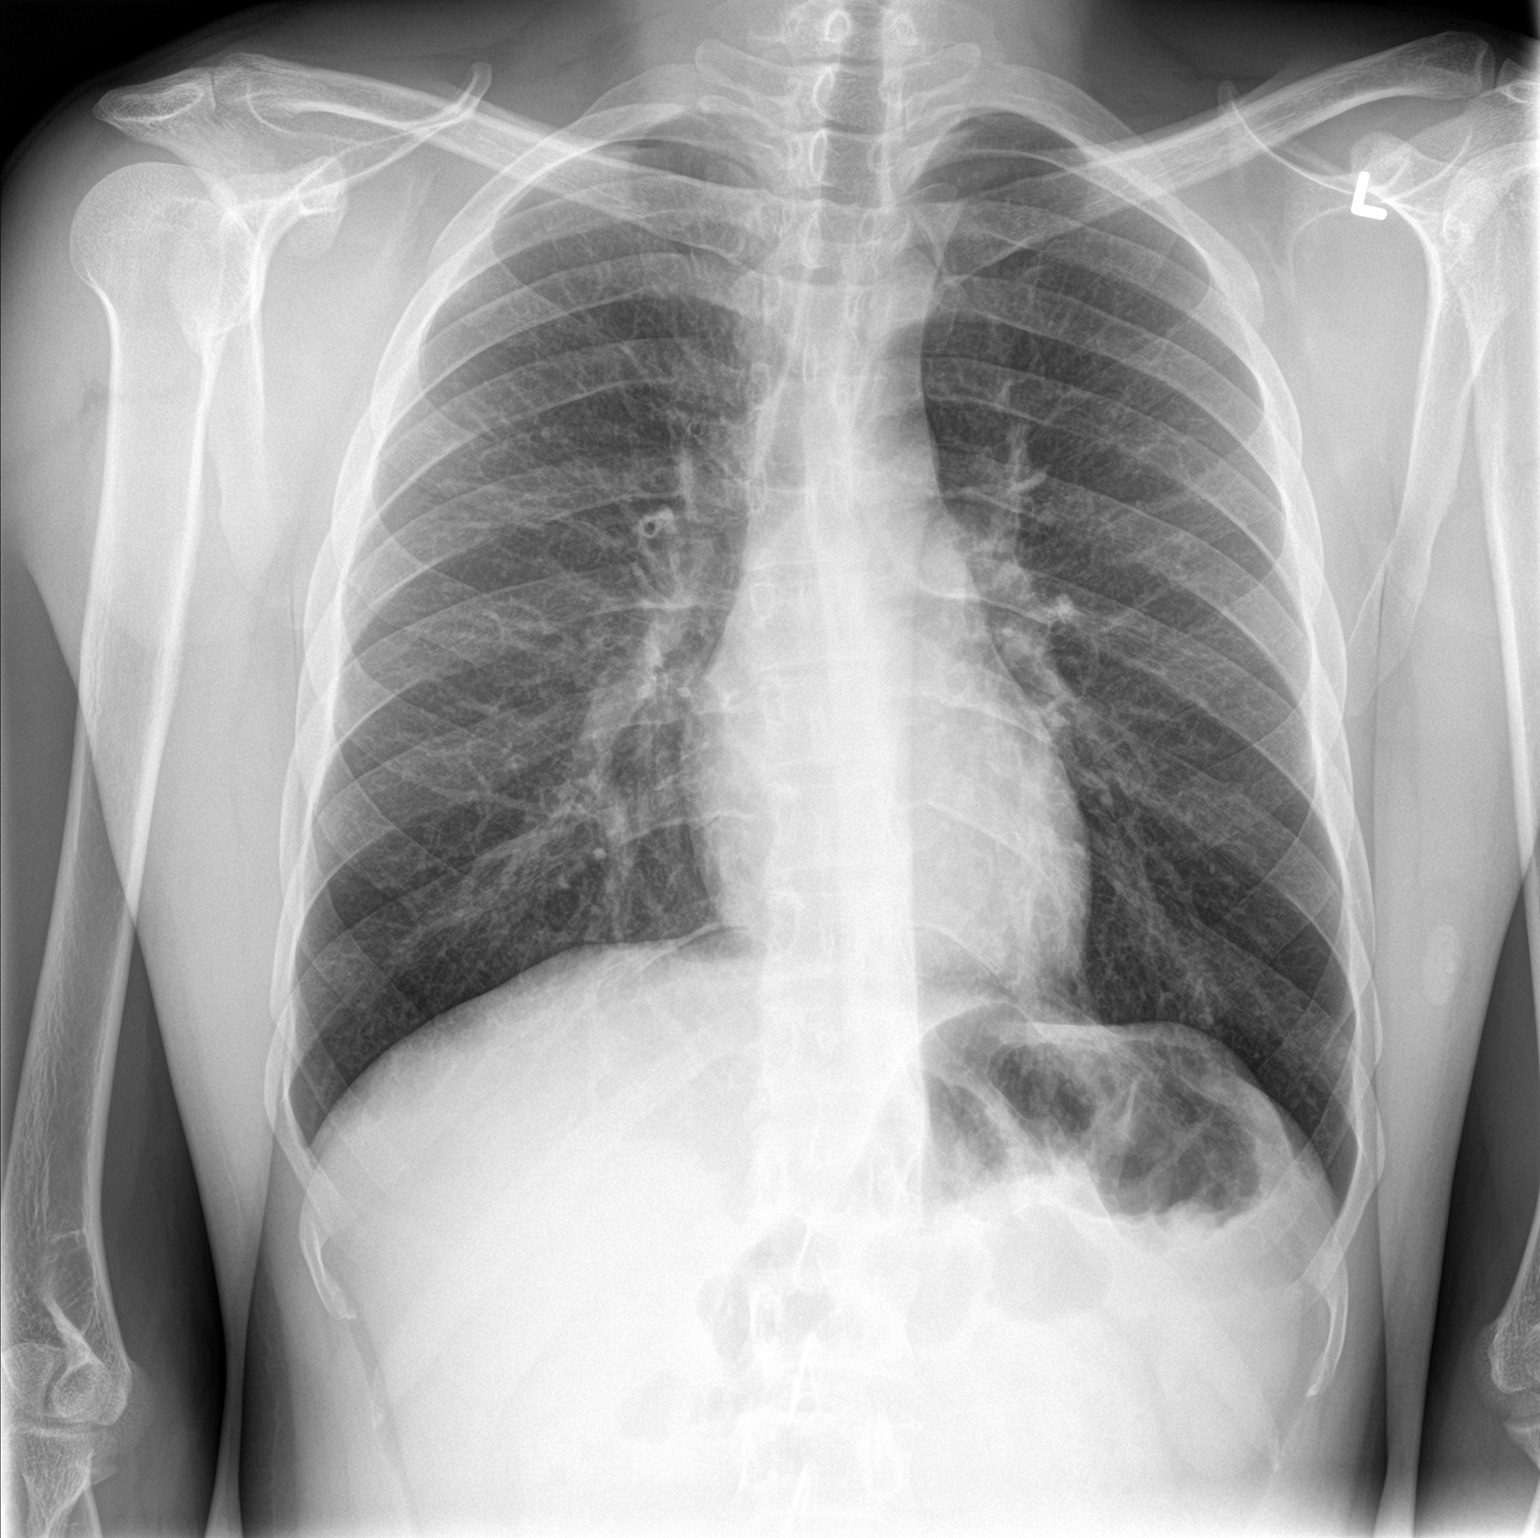

[chest lat]
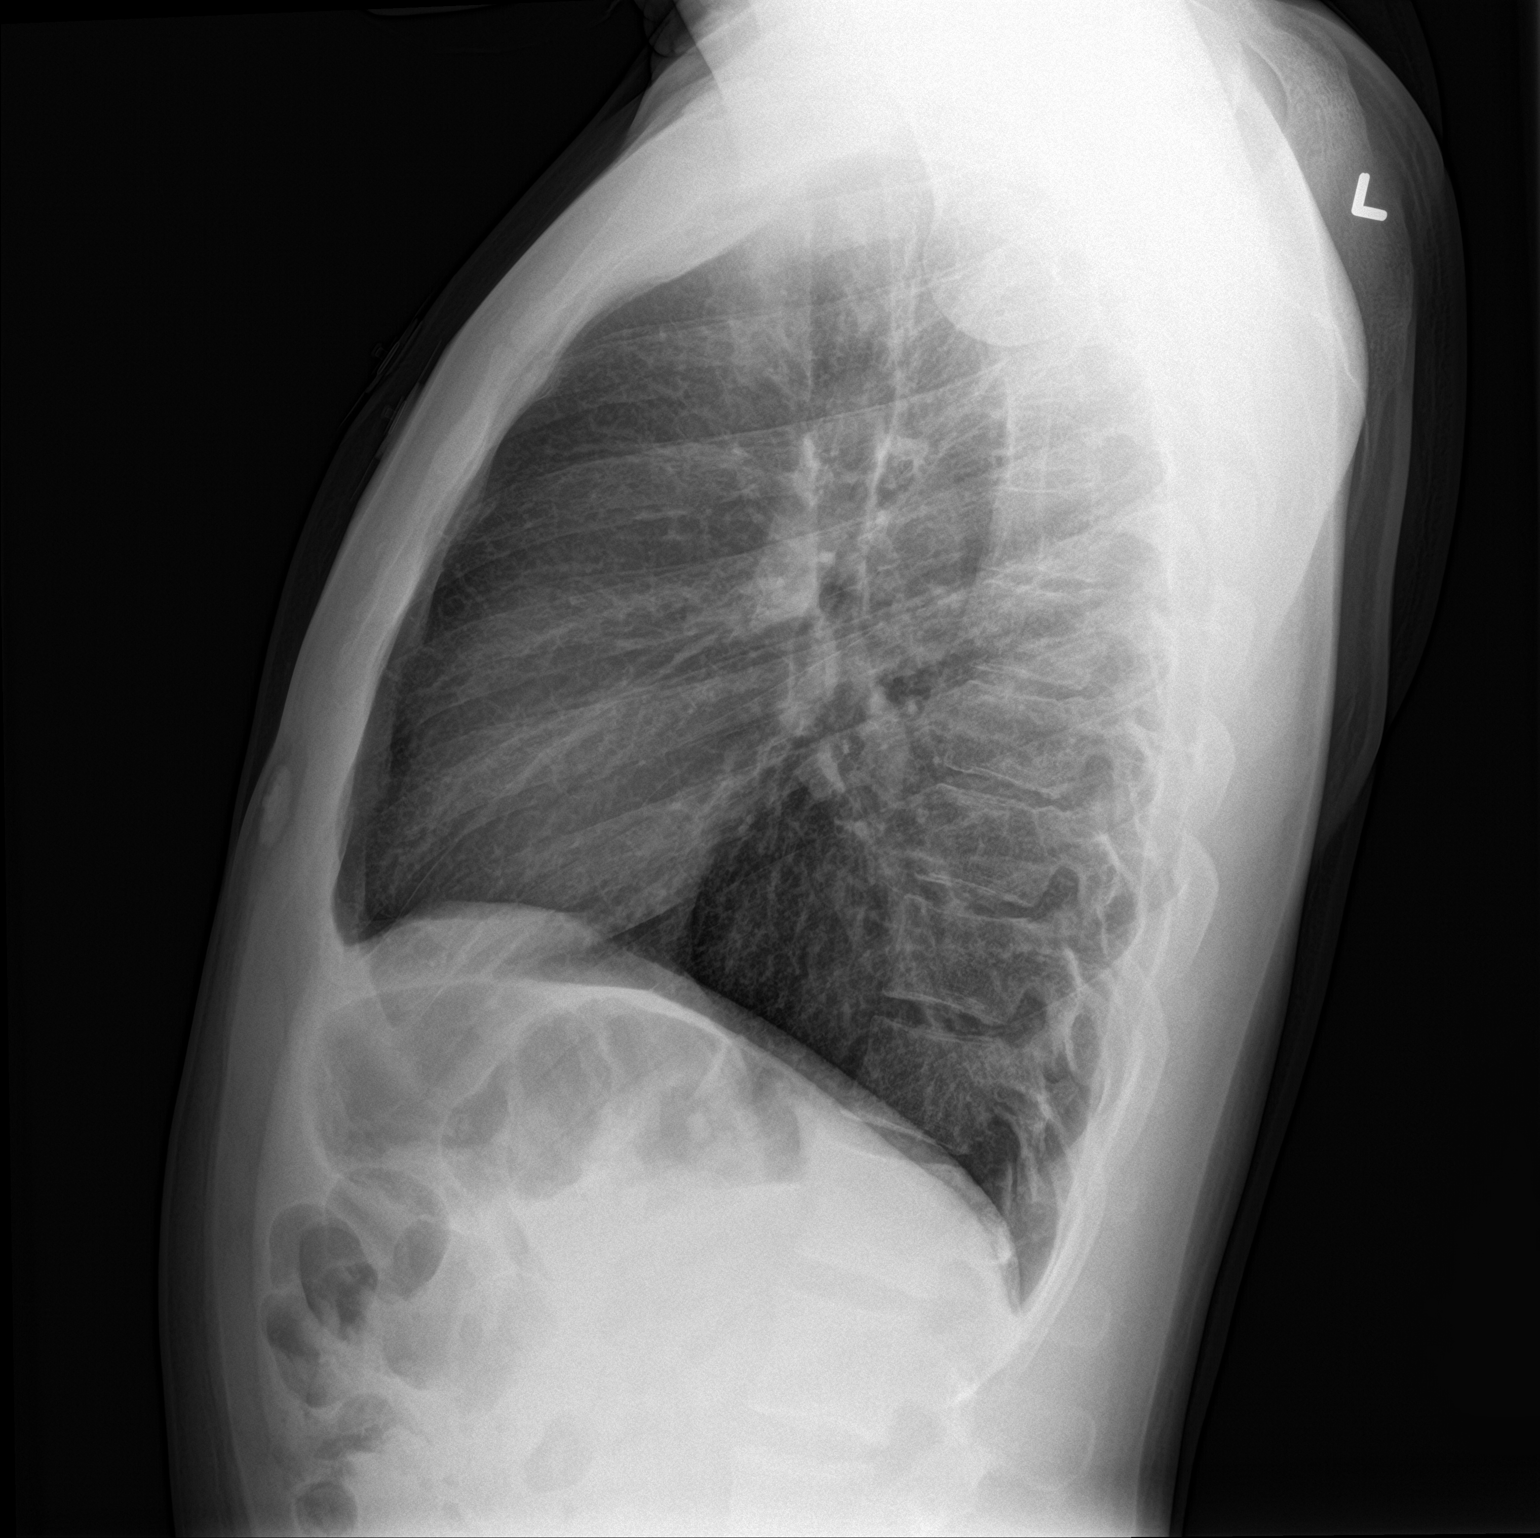

[2 of 2 positions shown; findings below may reference images not displayed]

FINDINGS: Hyperexpansion without focal airspace consolidation or pulmonary
edema. No pleural effusion. Interstitial markings are diffusely
coarsened with chronic features. The cardiopericardial silhouette is
within normal limits for size. The visualized bony structures of the
thorax are intact. Nodular density/densities projecting over the
lungs are compatible with pads for telemetry leads.
IMPRESSION: Hyperexpansion with underlying chronic interstitial changes.

## 2018-06-11 ENCOUNTER — Other Ambulatory Visit: Payer: Self-pay

## 2018-06-11 ENCOUNTER — Ambulatory Visit (INDEPENDENT_AMBULATORY_CARE_PROVIDER_SITE_OTHER): Payer: Managed Care, Other (non HMO) | Admitting: Family Medicine

## 2018-06-11 ENCOUNTER — Encounter: Payer: Self-pay | Admitting: Family Medicine

## 2018-06-11 VITALS — BP 112/68 | HR 84 | Temp 97.9°F | Resp 14 | Ht 69.5 in | Wt 175.0 lb

## 2018-06-11 DIAGNOSIS — J45909 Unspecified asthma, uncomplicated: Secondary | ICD-10-CM | POA: Diagnosis not present

## 2018-06-11 DIAGNOSIS — F1290 Cannabis use, unspecified, uncomplicated: Secondary | ICD-10-CM | POA: Insufficient documentation

## 2018-06-11 MED ORDER — ALBUTEROL SULFATE HFA 108 (90 BASE) MCG/ACT IN AERS: 2.0000 | INHALATION_SPRAY | Freq: Four times a day (QID) | RESPIRATORY_TRACT | 3 refills | Status: AC | PRN

## 2018-06-11 MED ORDER — ALBUTEROL SULFATE (2.5 MG/3ML) 0.083% IN NEBU: 2.5000 mg | INHALATION_SOLUTION | Freq: Four times a day (QID) | RESPIRATORY_TRACT | 3 refills | Status: AC | PRN

## 2018-06-11 MED ORDER — BUDESONIDE-FORMOTEROL FUMARATE 80-4.5 MCG/ACT IN AERO: 2.0000 | INHALATION_SPRAY | Freq: Two times a day (BID) | RESPIRATORY_TRACT | 3 refills | Status: AC

## 2018-06-11 NOTE — Patient Instructions (Addendum)
F/U as needed Schedule physical when your insurance comes back after July

## 2018-06-11 NOTE — Assessment & Plan Note (Signed)
Discussed marijauna effects on breathing, does not want to quit. Using albuterol almost daily, given symbicort to try  BID, prn albuterol use Nebulizer script given

## 2018-06-11 NOTE — Progress Notes (Signed)
   Subjective:    Patient ID: Robert [name], male    DOB: 26-Aug-1990, 28 y.o.   MRN: [id number]  Patient presents for Follow-up (insurance will end next month- wants check up for asthma)   Pt here to f/u asthma , change to smoke marijuana states he does not want to quit.  He has not had any significant problems with his asthma he states that he got a cold but it only lasted for about 5 days and he used his old nebulizer which he needs a new prescription for.  Unfortunately he will be losing his insurance when he turns 28 and needs his medications refilled.  He does state that he typically uses his inhaler or his nebulizer before bedtime said he does not have wheezing in the morning but he typically smokes first thing in the morning as well.  He also states that he lives in the basement of his grandmother's house and the cold air may be causing some of his chest tightness as well.   Occasions reviewed, declines flu shot flu shot  Review Of Systems:  GEN- denies fatigue, fever, weight loss,weakness, recent illness HEENT- denies eye drainage, change in vision, nasal discharge, CVS- denies chest pain, palpitations RESP- denies SOB, cough, wheeze ABD- denies N/V, change in stools, abd pain GU- denies dysuria, hematuria, dribbling, incontinence MSK- denies joint pain, muscle aches, injury Neuro- denies headache, dizziness, syncope, seizure activity       Objective:    BP 112/68   Pulse 84   Temp 97.9 F (36.6 C) (Oral)   Resp 14   Ht 5' 9.5" (1.765 m)   Wt 175 lb (79.4 kg)   SpO2 98%   BMI 25.47 kg/m  GEN- NAD, alert and oriented x3 HEENT- PERRL, EOMI, non injected sclera, pink conjunctiva, MMM, oropharynx clear Neck- Supple, no thyromegaly CVS- RRR, no murmur RESP-CTAB ABD-NABS,soft,NT,ND EXT- No edema Pulses- Radial 2+        Assessment & Plan:      Problem List Items Addressed This Visit      Unprioritized   Asthma - Primary    Discussed marijauna effects on  breathing, does not want to quit. Using albuterol almost daily, given symbicort to try  BID, prn albuterol use Nebulizer script given      Relevant Medications   budesonide-formoterol (SYMBICORT) 80-4.5 MCG/ACT inhaler   albuterol (PROVENTIL HFA;VENTOLIN HFA) 108 (90 Base) MCG/ACT inhaler   albuterol (PROVENTIL) (2.5 MG/3ML) 0.083% nebulizer solution   Marijuana use      Note: This dictation was prepared with Dragon dictation along with smaller phrase technology. Any transcriptional errors that result from this process are unintentional.

## 2019-08-13 ENCOUNTER — Ambulatory Visit: Payer: Self-pay | Attending: Internal Medicine

## 2019-08-13 DIAGNOSIS — Z23 Encounter for immunization: Secondary | ICD-10-CM

## 2019-08-13 NOTE — Progress Notes (Signed)
   Covid-19 Vaccination Clinic  Name:  Tyhir Schwan    MRN: [id number] DOB: Sep 27, 1990  08/13/2019  Mr. Shaheen was observed post Covid-19 immunization for 15 minutes without incident. He was provided with Vaccine Information Sheet and instruction to access the V-Safe system.   Mr. Boettner was instructed to call 911 with any severe reactions post vaccine: Marland Kitchen Difficulty breathing  . Swelling of face and throat  . A fast heartbeat  . A bad rash all over body  . Dizziness and weakness   Immunizations Administered    Name Date Dose VIS Date Route   Moderna COVID-19 Vaccine 08/13/2019 12:30 PM 0.5 mL 04/21/2019 Intramuscular   Manufacturer: Moderna   Lot: [id number]   NDC: 47096-283-66

## 2019-09-02 ENCOUNTER — Emergency Department (HOSPITAL_COMMUNITY)
Admission: EM | Admit: 2019-09-02 | Discharge: 2019-09-03 | Disposition: A | Discharge status: Home / Self Care | Payer: Self-pay | Attending: Emergency Medicine | Admitting: Emergency Medicine

## 2019-09-02 ENCOUNTER — Other Ambulatory Visit: Payer: Self-pay

## 2019-09-02 ENCOUNTER — Encounter (HOSPITAL_COMMUNITY): Payer: Self-pay | Admitting: *Deleted

## 2019-09-02 ENCOUNTER — Emergency Department (HOSPITAL_COMMUNITY): Payer: Self-pay

## 2019-09-02 DIAGNOSIS — R079 Chest pain, unspecified: Secondary | ICD-10-CM | POA: Insufficient documentation

## 2019-09-02 DIAGNOSIS — Z5321 Procedure and treatment not carried out due to patient leaving prior to being seen by health care provider: Secondary | ICD-10-CM | POA: Insufficient documentation

## 2019-09-02 DIAGNOSIS — R066 Hiccough: Secondary | ICD-10-CM | POA: Insufficient documentation

## 2019-09-02 DIAGNOSIS — R6884 Jaw pain: Secondary | ICD-10-CM | POA: Insufficient documentation

## 2019-09-02 LAB — BASIC METABOLIC PANEL
Anion gap: 12 (ref 5–15)
BUN: 7 mg/dL (ref 6–20)
CO2: 24 mmol/L (ref 22–32)
Calcium: 9.5 mg/dL (ref 8.9–10.3)
Chloride: 104 mmol/L (ref 98–111)
Creatinine, Ser: 1.09 mg/dL (ref 0.61–1.24)
GFR calc Af Amer: >60 mL/min (ref >60)
GFR calc non Af Amer: >60 mL/min (ref >60)
Glucose, Bld: 93 mg/dL (ref 70–99)
Potassium: 3.6 mmol/L (ref 3.5–5.1)
Sodium: 140 mmol/L (ref 135–145)

## 2019-09-02 LAB — CBC
HCT: 49.6 % (ref 39.0–52.0)
Hemoglobin: 16.6 g/dL (ref 13.0–17.0)
MCH: 30.5 pg (ref 26.0–34.0)
MCHC: 33.5 g/dL (ref 30.0–36.0)
MCV: 91.2 fL (ref 80.0–100.0)
Platelets: 298 10*3/uL (ref 150–400)
RBC: 5.44 MIL/uL (ref 4.22–5.81)
RDW: 12.7 % (ref 11.5–15.5)
WBC: 13 10*3/uL — ABNORMAL HIGH (ref 4.0–10.5)
nRBC: 0 % (ref 0.0–0.2)

## 2019-09-02 LAB — TROPONIN I (HIGH SENSITIVITY): Troponin I (High Sensitivity): 7 ng/L (ref <18)

## 2019-09-02 MED ORDER — SODIUM CHLORIDE 0.9% FLUSH: 3.0000 mL | Freq: Once | INTRAVENOUS | Status: DC

## 2019-09-02 NOTE — ED Triage Notes (Signed)
Pt with onset of jaw pain 2-3 days ago, says when he eats he feels the food causing pain in his chest, has also had increased hiccups/

## 2019-09-03 ENCOUNTER — Other Ambulatory Visit: Payer: Self-pay

## 2019-09-03 ENCOUNTER — Encounter (HOSPITAL_COMMUNITY): Payer: Self-pay

## 2019-09-03 ENCOUNTER — Emergency Department (HOSPITAL_COMMUNITY)
Admission: EM | Admit: 2019-09-03 | Discharge: 2019-09-03 | Disposition: A | Discharge status: Home / Self Care | Payer: Self-pay | Attending: Emergency Medicine | Admitting: Emergency Medicine

## 2019-09-03 DIAGNOSIS — Z8709 Personal history of other diseases of the respiratory system: Secondary | ICD-10-CM | POA: Insufficient documentation

## 2019-09-03 DIAGNOSIS — F17210 Nicotine dependence, cigarettes, uncomplicated: Secondary | ICD-10-CM | POA: Insufficient documentation

## 2019-09-03 DIAGNOSIS — R0789 Other chest pain: Secondary | ICD-10-CM | POA: Insufficient documentation

## 2019-09-03 DIAGNOSIS — R066 Hiccough: Secondary | ICD-10-CM | POA: Insufficient documentation

## 2019-09-03 NOTE — ED Triage Notes (Addendum)
Pt reports left sided jaw pain that radiates down to his chest as well as severe hiccups for the last 2 days. Pt a.o, nad noted. Pt seen last night but LWBS, blood work and imaging already done

## 2019-09-03 NOTE — ED Notes (Signed)
Patient verbalized understanding of dc instructions, vss, ambulatory with nad.   

## 2019-09-03 NOTE — ED Notes (Signed)
Called pt for vitals x 3 with no answer  

## 2019-09-03 NOTE — ED Provider Notes (Signed)
[facility] EMERGENCY DEPARTMENT Provider Note   CSN: [id number] Arrival date & time: 09/03/19  0849     History Chief Complaint  Patient presents with  . Hiccups  . Chest Pain    Robert [name] is a 29 y.o. male.  HPI He presents for evaluation of discomfort swallowing, hiccups, left jaw pain, chest pain, difficulty sleeping.  Onset of symptoms 2 to 3 days ago.  Hiccups are intermittent and cause both; chest pain, and repeated frequent hiccups.  He is a smoker.  He has history of asthma not currently with difficulty breathing.  No history of gastroesophageal reflux that he knows about.  He is not currently employed.  He denies having had Covid infection.  There are no other known modifying factors.    Past Medical History:  Diagnosis Date  . Asthma     Patient Active Problem List   Diagnosis Date Noted  . Marijuana use 06/11/2018  . Asthma 11/29/2017    Past Surgical History:  Procedure Laterality Date  . HERNIA REPAIR    . TONSILLECTOMY         Family History  Problem Relation Age of Onset  . Diabetes Father   . Diabetes Paternal Grandfather     Social History   Tobacco Use  . Smoking status: Current Every Day Smoker    Types: Cigarettes  . Smokeless tobacco: Former Engineer, water Use Topics  . Alcohol use: Yes    Alcohol/week: 1.0 standard drinks    Types: 1 Shots of liquor per week  . Drug use: Not Currently    Types: Marijuana    Home Medications Prior to Admission medications   Medication Sig Start Date End Date Taking? Authorizing Provider  albuterol (PROVENTIL HFA;VENTOLIN HFA) 108 (90 Base) MCG/ACT inhaler Inhale 2 puffs into the lungs every 6 (six) hours as needed for wheezing or shortness of breath. 06/11/18   [place], Velna Hatchet, MD  albuterol (PROVENTIL) (2.5 MG/3ML) 0.083% nebulizer solution Take 3 mLs (2.5 mg total) by nebulization every 6 (six) hours as needed for wheezing or shortness of breath. 06/11/18   Salley Scarlet, MD  budesonide-formoterol [facility]) 80-4.5 MCG/ACT inhaler Inhale 2 puffs into the lungs 2 (two) times daily. 06/11/18   Salley Scarlet, MD    Allergies    Peanut butter flavor  Review of Systems   Review of Systems  Physical Exam Updated Vital Signs BP 122/74   Pulse (!) 102   Temp 98.9 F (37.2 C) (Oral)   Resp (!) 21   Ht 5\' 10"  (1.778 m)   Wt 72.6 kg   SpO2 99%   BMI 22.96 kg/m   Physical Exam  ED Results / Procedures / Treatments   Labs (all labs ordered are listed, but only abnormal results are displayed) Labs Reviewed - No data to display  EKG EKG Interpretation  Date/Time:  Thursday September 03 2019 09:48:37 EDT Ventricular Rate:  103 PR Interval:    QRS Duration: 98 QT Interval:  334 QTC Calculation: 438 R Axis:   102 Text Interpretation: Sinus tachycardia Consider right atrial enlargement Borderline right axis deviation ST elev, probable normal early repol pattern since last tracing no significant change Confirmed by [id number] [phone]) on 09/03/2019 9:58:02 AM   Radiology DG Chest 2 View  Result Date: 09/02/2019 CLINICAL DATA:  Chest pain EXAM: CHEST - 2 VIEW COMPARISON:  05/19/2017 FINDINGS: The heart size and mediastinal contours are within normal limits. Both lungs are  clear. The visualized skeletal structures are unremarkable. IMPRESSION: No active cardiopulmonary disease. Electronically Signed   By: Donavan Foil M.D.   On: 09/02/2019 22:22    Procedures Procedures (including critical care time)  Medications Ordered in ED Medications - No data to display  ED Course  I have reviewed the triage vital signs and the nursing notes.  Pertinent labs & imaging results that were available during my care of the patient were reviewed by me and considered in my medical decision making (see chart for details).    MDM Rules/Calculators/A&P                       Patient Vitals for the past 24 hrs:  BP Temp Temp src Pulse Resp SpO2 Height Weight   09/03/19 0957 122/74 -- -- (!) 102 (!) 21 99 % -- --  09/03/19 0902 -- -- -- -- -- -- 5\' 10"  (1.778 m) 72.6 kg  09/03/19 0901 116/70 98.9 F (37.2 C) Oral (!) 102 18 98 % -- --    10:40 AM Reevaluation with update and discussion. After initial assessment and treatment, an updated evaluation reveals he is comfortable has no further complaints, findings discussed and questions answered. Daleen Bo   Medical Decision Making:  This patient is presenting for evaluation of swallowing difficulty, hiccups and chest pain, which does require a range of treatment options, and is a complaint that involves a moderate risk of morbidity and mortality. The differential diagnoses include gastroesophageal reflux, nonspecific diaphragmatic spasm, pulmonary infection, viral infection. I decided  to review old records, and in summary patient has chronic sporadic asthma, and is an ongoing smoker. I did not require additional historical information from anyone. Clinical Laboratory Tests Ordered, included CBC and chemistry panels.  These tests were actually done last night, and he left prior to return Radiologic Tests Ordered, included chest x-ray.  Done overnight, patient left prior to being evaluated I independently Visualized: Radiology images, which did not show CHF or infiltrate; Cardiac Monitor Tracing which shows sinus tachycardia.   Critical Interventions-clinical evaluation  After These Interventions, the Patient was reevaluated and was found stable for discharge.  I suspect that he has reflux causing hiccups and swelling discomfort.  No evidence for acute thoracic or abdominal processes, which could contribute to a difficulty which he describes.  He is stable for discharge without further evaluation and treatment.  CRITICAL CARE-no Performed by: Daleen Bo   Nursing Notes Reviewed/ Care Coordinated Applicable Imaging Reviewed Interpretation of Laboratory Data incorporated into ED treatment  The  patient appears reasonably screened and/or stabilized for discharge and I doubt any other medical condition or other [facility] requiring further screening, evaluation, or treatment in the ED at this time prior to discharge.  Plan: Home Medications-continue Prilosec and supplement with a gastric acid blocker or antacid for a week or 2; Home Treatments-gradual advance diet; return here if the recommended treatment, does not improve the symptoms; Recommended follow up-PCP follow-up if needed.    Final Clinical Impression(s) / ED Diagnoses Final diagnoses:  Hiccoughs    Rx / DC Orders ED Discharge Orders    None       Daleen Bo, MD 09/03/19 1040

## 2019-09-03 NOTE — Discharge Instructions (Addendum)
It is not clear what is causing your hiccups.  It may be gastroesophageal reflux.  Continue taking Prilosec, but also use an antacid such as Maalox or Tums, or Pepcid, for the first 1 to 2 weeks while taking the Prilosec.  He will probably help to take the Prilosec for at least 3 weeks.  Follow-up with your doctor as needed for problems.

## 2019-09-03 NOTE — ED Notes (Signed)
ED Provider at bedside. 

## 2019-09-10 ENCOUNTER — Ambulatory Visit: Payer: Self-pay | Attending: Internal Medicine

## 2019-09-10 DIAGNOSIS — Z23 Encounter for immunization: Secondary | ICD-10-CM

## 2019-09-10 NOTE — Progress Notes (Signed)
   Covid-19 Vaccination Clinic  Name:  Spenser Harren    MRN: [id number] DOB: 15-Apr-1991  09/10/2019  Mr. Leanos was observed post Covid-19 immunization for 15 minutes without incident. He was provided with Vaccine Information Sheet and instruction to access the V-Safe system.   Mr. Vasco was instructed to call 911 with any severe reactions post vaccine: Marland Kitchen Difficulty breathing  . Swelling of face and throat  . A fast heartbeat  . A bad rash all over body  . Dizziness and weakness   Immunizations Administered    Name Date Dose VIS Date Route   Moderna COVID-19 Vaccine 09/10/2019 11:36 AM 0.5 mL 04/2019 Intramuscular   Manufacturer: Moderna   Lot: [id number]   NDC: 69450-388-82

## 2021-05-12 ENCOUNTER — Ambulatory Visit
Admission: EM | Admit: 2021-05-12 | Discharge: 2021-05-12 | Disposition: A | Discharge status: Home / Self Care | Payer: Self-pay

## 2021-05-12 ENCOUNTER — Encounter: Payer: Self-pay | Admitting: Emergency Medicine

## 2021-05-12 DIAGNOSIS — S4991XA Unspecified injury of right shoulder and upper arm, initial encounter: Secondary | ICD-10-CM

## 2021-05-12 NOTE — ED Triage Notes (Signed)
Forklift ran into his shoulder at work. Continued to work. Went home, tried picking up his shoulder, felt it pop. Has had increasingly worsening right shoulder pain. Denies shortness of breath

## 2021-05-12 NOTE — ED Provider Notes (Signed)
Patient here today for evaluation of right shoulder injury that occurred at work yesterday. 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    Tomi Bamberger, PA-C 05/12/21 1652
